# Patient Record
Sex: Female | Born: 1955 | Race: White | Hispanic: No | Marital: Married | Smoking: Never smoker
Health system: Southern US, Community
[De-identification: ages and names within clinical notes are randomized; demographics above are authoritative.]

## PROBLEM LIST (undated history)

## (undated) DIAGNOSIS — G4730 Sleep apnea, unspecified: Secondary | ICD-10-CM

## (undated) DIAGNOSIS — M1990 Unspecified osteoarthritis, unspecified site: Secondary | ICD-10-CM

## (undated) DIAGNOSIS — K219 Gastro-esophageal reflux disease without esophagitis: Secondary | ICD-10-CM

## (undated) DIAGNOSIS — E279 Disorder of adrenal gland, unspecified: Secondary | ICD-10-CM

## (undated) DIAGNOSIS — N952 Postmenopausal atrophic vaginitis: Secondary | ICD-10-CM

## (undated) DIAGNOSIS — C439 Malignant melanoma of skin, unspecified: Secondary | ICD-10-CM

## (undated) DIAGNOSIS — N950 Postmenopausal bleeding: Secondary | ICD-10-CM

## (undated) DIAGNOSIS — R87619 Unspecified abnormal cytological findings in specimens from cervix uteri: Secondary | ICD-10-CM

## (undated) DIAGNOSIS — B3782 Candidal enteritis: Secondary | ICD-10-CM

## (undated) DIAGNOSIS — K5790 Diverticulosis of intestine, part unspecified, without perforation or abscess without bleeding: Secondary | ICD-10-CM

## (undated) DIAGNOSIS — E039 Hypothyroidism, unspecified: Secondary | ICD-10-CM

## (undated) DIAGNOSIS — F419 Anxiety disorder, unspecified: Secondary | ICD-10-CM

## (undated) DIAGNOSIS — Z9882 Breast implant status: Secondary | ICD-10-CM

## (undated) DIAGNOSIS — K9041 Non-celiac gluten sensitivity: Secondary | ICD-10-CM

## (undated) HISTORY — DX: Malignant melanoma of skin, unspecified: C43.9

## (undated) HISTORY — DX: Sleep apnea, unspecified: G47.30

## (undated) HISTORY — DX: Unspecified abnormal cytological findings in specimens from cervix uteri: R87.619

## (undated) HISTORY — DX: Anxiety disorder, unspecified: F41.9

## (undated) HISTORY — DX: Non-celiac gluten sensitivity: K90.41

## (undated) HISTORY — DX: Breast implant status: Z98.82

## (undated) HISTORY — DX: Gastro-esophageal reflux disease without esophagitis: K21.9

## (undated) HISTORY — PX: CRYOTHERAPY: SHX1416

## (undated) HISTORY — DX: Postmenopausal atrophic vaginitis: N95.2

## (undated) HISTORY — DX: Diverticulosis of intestine, part unspecified, without perforation or abscess without bleeding: K57.90

## (undated) HISTORY — PX: MYOMECTOMY: SHX85

## (undated) HISTORY — PX: BREAST ENHANCEMENT SURGERY: SHX7

## (undated) HISTORY — DX: Candidal enteritis: B37.82

## (undated) HISTORY — DX: Postmenopausal bleeding: N95.0

## (undated) HISTORY — DX: Disorder of adrenal gland, unspecified: E27.9

## (undated) HISTORY — PX: BREAST SURGERY: SHX581

## (undated) HISTORY — DX: Unspecified osteoarthritis, unspecified site: M19.90

---

## 1999-12-16 DIAGNOSIS — C439 Malignant melanoma of skin, unspecified: Secondary | ICD-10-CM

## 1999-12-16 HISTORY — PX: MELANOMA EXCISION: SHX5266

## 1999-12-16 HISTORY — DX: Malignant melanoma of skin, unspecified: C43.9

## 2008-02-13 DIAGNOSIS — K5790 Diverticulosis of intestine, part unspecified, without perforation or abscess without bleeding: Secondary | ICD-10-CM

## 2008-02-13 HISTORY — DX: Diverticulosis of intestine, part unspecified, without perforation or abscess without bleeding: K57.90

## 2008-07-17 ENCOUNTER — Encounter: Payer: Self-pay | Admitting: Gastroenterology

## 2008-07-17 LAB — HM COLONOSCOPY: HM Colonoscopy: NORMAL

## 2008-10-27 ENCOUNTER — Encounter: Payer: Self-pay | Admitting: Gastroenterology

## 2008-11-02 ENCOUNTER — Encounter: Payer: Self-pay | Admitting: Gastroenterology

## 2009-01-12 ENCOUNTER — Ambulatory Visit: Payer: Self-pay | Admitting: Gastroenterology

## 2009-01-12 DIAGNOSIS — C439 Malignant melanoma of skin, unspecified: Secondary | ICD-10-CM | POA: Insufficient documentation

## 2009-01-12 DIAGNOSIS — K219 Gastro-esophageal reflux disease without esophagitis: Secondary | ICD-10-CM | POA: Insufficient documentation

## 2009-01-12 DIAGNOSIS — K5730 Diverticulosis of large intestine without perforation or abscess without bleeding: Secondary | ICD-10-CM | POA: Insufficient documentation

## 2009-01-12 DIAGNOSIS — R197 Diarrhea, unspecified: Secondary | ICD-10-CM | POA: Insufficient documentation

## 2009-01-12 LAB — CONVERTED CEMR LAB
AST: 26 units/L (ref 0–37)
CO2: 31 meq/L (ref 19–32)
Chloride: 103 meq/L (ref 96–112)
Eosinophils Absolute: 0.1 10*3/uL (ref 0.0–0.7)
Eosinophils Relative: 2 % (ref 0.0–5.0)
Ferritin: 52.1 ng/mL (ref 10.0–291.0)
Folate: 19.9 ng/mL
Glucose, Bld: 100 mg/dL — ABNORMAL HIGH (ref 70–99)
Iron: 72 ug/dL (ref 42–145)
Lymphocytes Relative: 26.7 % (ref 12.0–46.0)
MCHC: 34.7 g/dL (ref 30.0–36.0)
Monocytes Absolute: 0.4 10*3/uL (ref 0.1–1.0)
Potassium: 4 meq/L (ref 3.5–5.1)
RBC: 4.46 M/uL (ref 3.87–5.11)
Saturation Ratios: 18.7 % — ABNORMAL LOW (ref 20.0–50.0)
Sodium: 140 meq/L (ref 135–145)
TSH: 1.45 microintl units/mL (ref 0.35–5.50)
Tissue Transglutaminase Ab, IgA: 0.4 units (ref <7)
Total Bilirubin: 1.3 mg/dL — ABNORMAL HIGH (ref 0.3–1.2)
Total Protein: 7.2 g/dL (ref 6.0–8.3)
Transferrin: 274.6 mg/dL (ref >=212.0)
WBC: 3.2 10*3/uL — ABNORMAL LOW (ref 4.5–10.5)

## 2009-01-16 ENCOUNTER — Encounter: Payer: Self-pay | Admitting: Gastroenterology

## 2009-01-24 ENCOUNTER — Telehealth: Payer: Self-pay | Admitting: Gastroenterology

## 2009-01-25 ENCOUNTER — Telehealth: Payer: Self-pay | Admitting: Gastroenterology

## 2009-02-13 ENCOUNTER — Ambulatory Visit: Payer: Self-pay | Admitting: Gastroenterology

## 2009-02-13 DIAGNOSIS — R21 Rash and other nonspecific skin eruption: Secondary | ICD-10-CM | POA: Insufficient documentation

## 2009-02-21 ENCOUNTER — Ambulatory Visit (HOSPITAL_COMMUNITY): Admission: RE | Admit: 2009-02-21 | Discharge: 2009-02-21 | Payer: Self-pay | Admitting: Gastroenterology

## 2009-03-01 ENCOUNTER — Telehealth: Payer: Self-pay | Admitting: Gastroenterology

## 2009-03-01 ENCOUNTER — Ambulatory Visit: Payer: Self-pay | Admitting: Gastroenterology

## 2009-04-11 ENCOUNTER — Telehealth (INDEPENDENT_AMBULATORY_CARE_PROVIDER_SITE_OTHER): Payer: Self-pay | Admitting: *Deleted

## 2009-04-23 ENCOUNTER — Ambulatory Visit: Payer: Self-pay | Admitting: Gastroenterology

## 2009-04-23 LAB — CONVERTED CEMR LAB
Basophils Absolute: 0 10*3/uL (ref 0.0–0.1)
Hemoglobin: 12.9 g/dL (ref 12.0–15.0)
Lymphocytes Relative: 34.1 % (ref 12.0–46.0)
Lymphs Abs: 1 10*3/uL (ref 0.7–4.0)
MCHC: 35.1 g/dL (ref 30.0–36.0)
Monocytes Absolute: 0.3 10*3/uL (ref 0.1–1.0)
Monocytes Relative: 10.9 % (ref 3.0–12.0)
Neutro Abs: 1.5 10*3/uL (ref 1.4–7.7)
RBC: 4.22 M/uL (ref 3.87–5.11)
RDW: 12.4 % (ref 11.5–14.6)

## 2009-04-24 ENCOUNTER — Ambulatory Visit: Payer: Self-pay | Admitting: Gastroenterology

## 2009-04-24 DIAGNOSIS — K589 Irritable bowel syndrome without diarrhea: Secondary | ICD-10-CM | POA: Insufficient documentation

## 2009-04-26 ENCOUNTER — Ambulatory Visit: Payer: Self-pay | Admitting: Oncology

## 2009-05-03 ENCOUNTER — Telehealth: Payer: Self-pay | Admitting: Gastroenterology

## 2009-05-09 DIAGNOSIS — D72819 Decreased white blood cell count, unspecified: Secondary | ICD-10-CM | POA: Insufficient documentation

## 2009-05-23 ENCOUNTER — Encounter: Payer: Self-pay | Admitting: Gastroenterology

## 2009-05-23 LAB — CBC & DIFF AND RETIC
Basophils Absolute: 0.1 10*3/uL (ref 0.0–0.1)
EOS%: 1.7 % (ref 0.0–7.0)
Eosinophils Absolute: 0.1 10*3/uL (ref 0.0–0.5)
HGB: 13.2 g/dL (ref 11.6–15.9)
IRF: 0.31 (ref 0.130–0.330)
LYMPH%: 30 % (ref 14.0–49.7)
MCHC: 35.2 g/dL (ref 31.5–36.0)
MONO#: 0.6 10*3/uL (ref 0.1–0.9)
MONO%: 14.3 % — ABNORMAL HIGH (ref 0.0–14.0)
NEUT%: 52.8 % (ref 38.4–76.8)
RBC: 4.38 10*6/uL (ref 3.70–5.45)
lymph#: 1.2 10*3/uL (ref 0.9–3.3)

## 2009-05-23 LAB — MORPHOLOGY: PLT EST: ADEQUATE

## 2009-05-23 LAB — CHCC SMEAR

## 2009-05-23 LAB — COMPREHENSIVE METABOLIC PANEL
ALT: 22 U/L (ref 0–35)
AST: 25 U/L (ref 0–37)
Albumin: 4.4 g/dL (ref 3.5–5.2)
Alkaline Phosphatase: 66 U/L (ref 39–117)
CO2: 31 mEq/L (ref 19–32)
Calcium: 9.3 mg/dL (ref 8.4–10.5)
Creatinine, Ser: 0.78 mg/dL (ref 0.40–1.20)
Total Protein: 7 g/dL (ref 6.0–8.3)

## 2009-05-25 LAB — IMMUNOFIXATION ELECTROPHORESIS
IgA: 289 mg/dL (ref 68–378)
IgG (Immunoglobin G), Serum: 899 mg/dL (ref 694–1618)
IgM, Serum: 195 mg/dL (ref 60–263)
Total Protein, Serum Electrophoresis: 7.4 g/dL (ref 6.0–8.3)

## 2009-05-25 LAB — ANA: Anti Nuclear Antibody(ANA): NEGATIVE

## 2009-05-25 LAB — SEDIMENTATION RATE: Sed Rate: 2 mm/h (ref 0–22)

## 2009-05-25 LAB — HIV ANTIBODY (ROUTINE TESTING W REFLEX)

## 2009-12-15 DIAGNOSIS — B3782 Candidal enteritis: Secondary | ICD-10-CM

## 2009-12-15 HISTORY — DX: Candidal enteritis: B37.82

## 2010-02-22 ENCOUNTER — Telehealth: Payer: Self-pay | Admitting: Gastroenterology

## 2010-04-19 ENCOUNTER — Encounter: Admission: RE | Admit: 2010-04-19 | Discharge: 2010-04-19 | Payer: Self-pay | Admitting: Radiology

## 2010-04-19 ENCOUNTER — Telehealth (INDEPENDENT_AMBULATORY_CARE_PROVIDER_SITE_OTHER): Payer: Self-pay | Admitting: *Deleted

## 2010-06-06 ENCOUNTER — Encounter: Admission: RE | Admit: 2010-06-06 | Discharge: 2010-06-06 | Payer: Self-pay | Admitting: *Deleted

## 2010-12-15 DIAGNOSIS — K9041 Non-celiac gluten sensitivity: Secondary | ICD-10-CM

## 2010-12-15 HISTORY — DX: Non-celiac gluten sensitivity: K90.41

## 2011-01-14 NOTE — Progress Notes (Signed)
  Phone Note Other Incoming   Request: Send information Summary of Call: Request for records received from [facility]. Records faxed to [phone].

## 2011-01-14 NOTE — Progress Notes (Signed)
Summary: triage  Phone Note Call from Patient Call back at Home Phone [phone]   Caller: Patient Call For: Dr. Jarold Motto Reason for Call: Talk to Nurse Summary of Call: pt having night sweats, diarrhea and would like something called in for her... [facility] [address] [facility] [place] Initial call taken by: Vallarie Mare,  February 22, 2010 10:03 AM  Follow-up for Phone Call        Pt states that she had the flu 4 weeks ago and had some diarrhea at that time which improved.  On Feb 26 she was given a zpak for sinus infection and she again had dairrhea which also improved.  Last saturday she ate barbeque and both pt and her husband became sick the next day.  Pt had diarrhea, aches and chills.  Husband has returned to normal but pt is still having loose stools and night sweats.  She thinks she needs something to help her get back to normal.  She asks about an antiobiotic for her "stomach." Follow-up by: Ashok Cordia RN,  February 22, 2010 11:23 AM  Additional Follow-up for Phone Call Additional follow up Details #1::        prescribed Florstar daily metronidazole 250 mg t.i.d. for 10 days.  Additional Follow-up by: Mardella Layman MD FACG,  February 22, 2010 11:58 AM    Additional Follow-up for Phone Call Additional follow up Details #2::    Pt notified.  Pt instructed to report back if does not improve. Follow-up by: Ashok Cordia RN,  February 22, 2010 12:57 PM  New/Updated Medications: METRONIDAZOLE 250 MG  TABS (METRONIDAZOLE) Take 1 three times a day for 10 days FLORASTOR 250 MG CAPS (SACCHAROMYCES BOULARDII) daily for 10 days Prescriptions: METRONIDAZOLE 250 MG  TABS (METRONIDAZOLE) Take 1 three times a day for 10 days  #30 x 0   Entered by:   Ashok Cordia RN   Authorized by:   Mardella Layman MD [facility]   Signed by:   Ashok Cordia RN on 02/22/2010   Method used:   [facility] to        [facility]  [phone]* (retail)       [address] [place], [place]   [address]       Ph: [phone] or [phone]       Fax: [phone]   RxID:   [id number]

## 2011-12-16 DIAGNOSIS — E279 Disorder of adrenal gland, unspecified: Secondary | ICD-10-CM

## 2011-12-16 HISTORY — DX: Disorder of adrenal gland, unspecified: E27.9

## 2012-09-14 DIAGNOSIS — N950 Postmenopausal bleeding: Secondary | ICD-10-CM

## 2012-09-14 HISTORY — DX: Postmenopausal bleeding: N95.0

## 2013-09-15 ENCOUNTER — Encounter: Payer: Self-pay | Admitting: Nurse Practitioner

## 2013-09-16 ENCOUNTER — Ambulatory Visit: Payer: Self-pay | Admitting: Nurse Practitioner

## 2013-10-06 ENCOUNTER — Encounter: Payer: Self-pay | Admitting: Nurse Practitioner

## 2013-10-06 ENCOUNTER — Ambulatory Visit (INDEPENDENT_AMBULATORY_CARE_PROVIDER_SITE_OTHER): Payer: BC Managed Care – PPO | Admitting: Nurse Practitioner

## 2013-10-06 VITALS — BP 100/60 | HR 88 | Resp 16 | Ht 66.0 in | Wt 112.0 lb

## 2013-10-06 DIAGNOSIS — Z01419 Encounter for gynecological examination (general) (routine) without abnormal findings: Secondary | ICD-10-CM

## 2013-10-06 DIAGNOSIS — Z0000 Encounter for general adult medical examination without abnormal findings: Secondary | ICD-10-CM

## 2013-10-06 LAB — POCT URINALYSIS DIPSTICK
Bilirubin, UA: NEGATIVE
Blood, UA: NEGATIVE
Glucose, UA: NEGATIVE
Ketones, UA: NEGATIVE
Nitrite, UA: NEGATIVE
Protein, UA: NEGATIVE
pH, UA: 8

## 2013-10-06 NOTE — Progress Notes (Signed)
Patient ID: Frances Bright, female   DOB: December 26, 1955, 57 y.o.   MRN: [id number] 57 y.o. G0P0 Married Caucasian Fe here for annual exam.  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  Still on HRT with using testosterone cream now.  Also on Minivelle dot and Provera.  No bleeding since 10/13 and had negative PUS and SHGM. This was felt to be secondary to bio-identical HRT with changing doses.  No LMP recorded. Patient is postmenopausal.          Sexually active: yes  The current method of family planning is post menopausal status.    Exercising: yes  WALKING Smoker:  no  Health Maintenance: Pap: 09/14/12, WNL, NEG HR HPV MMG: 04/06/13, [facility] Colonoscopy: 07/17/08, normal, repeat 10 years BMD: 2009, normal TDaP: ? Labs: HB: 13.4 Urine: negative, pH 8.0   reports that she has never smoked. She has never used smokeless tobacco. She reports that she does not drink alcohol or use illicit drugs.  Past Medical History  Diagnosis Date  . Atrophic vaginitis   . H/O bilateral breast implants   . Melanoma 2001    back of neck  . Diverticulosis 3/09    Past Surgical History  Procedure Laterality Date  . Breast enhancement surgery Bilateral 30 years ago    silicone, replaced with saline 6 years ago  . Melanoma excision  2001    back of neck    Current Outpatient Prescriptions  Medication Sig Dispense Refill  . Amino Acids (AMINO ACID PO) Take by mouth.      Marland Kitchen amitriptyline (ELAVIL) 10 MG tablet Take 1 tablet by mouth at bedtime.      Marland Kitchen CALCIUM PO Take by mouth.      . Cholecalciferol (VITAMIN D PO) Take 100 mg by mouth 3 (three) times daily.      . Iodine Strong, Lugols, (IODINE STRONG PO) Take by mouth.      . Magnesium 200 MG TABS Take 1 tablet by mouth 3 (three) times daily.      Marland Kitchen MINIVELLE 0.0375 MG/24HR Place 1 patch onto the skin 2 (two) times a week.      . NONFORMULARY OR COMPOUNDED ITEM Apply topically daily.      . Nutritional Supplements (DHEA PO) Take 10 mg  by mouth daily.      . NYSTATIN PO Take 1 tablet by mouth 3 (three) times daily.      . Omega-3 Fatty Acids (FISH OIL) 600 MG CAPS Take by mouth.      Marland Kitchen OVER THE COUNTER MEDICATION VITALZYMES. Daily      . Probiotic Product (PROBIOTIC DAILY PO) Take by mouth.      . progesterone (PROMETRIUM) 100 MG capsule Take 100 mg by mouth daily.      . vitamin B-12 (CYANOCOBALAMIN) 1000 MCG tablet Take 1,000 mcg by mouth daily.       No current facility-administered medications for this visit.    No family history on file.  ROS:  Pertinent items are noted in HPI.  Otherwise, a comprehensive ROS was negative.  Exam:   BP 100/60  Pulse 88  Resp 16  Ht 5\' 6"  (1.676 m)  Wt 112 lb (50.803 kg)  BMI 18.09 kg/m2 Height: 5\' 6"  (167.6 cm)  Ht Readings from Last 3 Encounters:  10/06/13 5\' 6"  (1.676 m)  04/24/09 5\' 6"  (1.676 m)  03/01/09 5\' 6"  (1.676 m)    General appearance: alert, cooperative and appears stated age  Head: Normocephalic, without obvious abnormality, atraumatic Neck: no adenopathy, supple, symmetrical, trachea midline and thyroid normal to inspection and palpation Lungs: clear to auscultation bilaterally Breasts: positive findings: implants bilaterally without mass. She has capsulation of implants with right side harder. Heart: regular rate and rhythm Abdomen: soft, non-tender; no masses,  no organomegaly Extremities: extremities normal, atraumatic, no cyanosis or edema Skin: Skin color, texture, turgor normal. No rashes or lesions Lymph nodes: Cervical, supraclavicular, and axillary nodes normal. No abnormal inguinal nodes palpated Neurologic: Grossly normal   Pelvic: External genitalia:  red lesions right inner thigh that looks like eczema.              Urethra:  normal appearing urethra with no masses, tenderness or lesions              Bartholin's and Skene's: normal                 Vagina: normal appearing vagina with normal color and discharge, no lesions               Cervix: anteverted              Pap taken: no Bimanual Exam:  Uterus:  normal size, contour, position, consistency, mobility, non-tender              Adnexa: no mass, fullness, tenderness               Rectovaginal: Confirms               Anus:  normal sphincter tone, no lesions  A:  Well Woman with normal exam  Postmenopausal on HRT - bio-identical HRT from PCP  PMB 09/2012 with negative PUS/ SHGM  Chronic problems with yeast - now on maintenance with PCP  P:   Pap smear as per guidelines   Mammogram patient needs a 6 month follow up on a lymph node but very reluctant to get done because of the amount of pain. She is encouraged to go back.  Counseled on breast self exam, adequate intake of calcium and vitamin D, diet and exercise return annually or prn  An After Visit Summary was printed and given to the patient.

## 2013-10-06 NOTE — Patient Instructions (Signed)

## 2013-10-09 NOTE — Progress Notes (Signed)
Encounter reviewed by Dr. Anisten Tomassi Silva.  

## 2014-02-09 ENCOUNTER — Telehealth: Payer: Self-pay | Admitting: *Deleted

## 2014-02-09 NOTE — Telephone Encounter (Signed)
Please contact [facility] to see if patient had MMG follow up in 08-2013 and get report.  If not, please contact her for scheduling.  Notify me of date or if declines to schedule.

## 2014-02-10 NOTE — Telephone Encounter (Signed)
Called [facility] and pt's last MMG and [place] was 03/2013. Patient hasn't followed up since then.  - LM for pt to call back re: MMG.

## 2014-02-13 NOTE — Telephone Encounter (Signed)
Was due in sep 2014 for a bilateral 3D diagnostic mammogram Patient has not been.

## 2014-02-13 NOTE — Telephone Encounter (Signed)
Second attempt to contact pt.  - LM to call back re: scheduling MMG.

## 2014-03-01 ENCOUNTER — Encounter: Payer: Self-pay | Admitting: Obstetrics & Gynecology

## 2014-03-02 NOTE — Telephone Encounter (Signed)
Letter written.  Out of recall.

## 2014-09-06 ENCOUNTER — Telehealth: Payer: Self-pay | Admitting: *Deleted

## 2014-09-06 NOTE — Telephone Encounter (Signed)
Patient had 04/06/13 Breast US recommendations were for a 3D mammogram in September of 2014. Patient has not scheduled.  Left Message To Call Back to schedule mammogram.

## 2014-09-11 NOTE — Telephone Encounter (Signed)
Left Message To Call Back  

## 2014-09-11 NOTE — Telephone Encounter (Signed)
Patient called back States she hasn't had her Mammogram because she has had bad experiencse in the past with getting her mammograms and she's hesitant because of it. She's experienced severe pain with mammogram. She has breast implants in and thinks her Left breast may be ruptured and she wants to have a MRI as opposed to a mammogram she thinks that having a mammogram will make it worse. She said she knows her body and she doesn't want to make it worse, and that she knows that she needs to get her breast implants replaced but she doesn't want to spend unnecessarily money if she doesn't have too. Says if Dr. Darryll Capers will exam her that's fine. Told the patient that Dr. Sabra Heck is out of the office this week, but will be back next week and that I would send her a message and get back with her. The patient is okay with waiting and appreciative of [place] following up with her on this.   Please advise.

## 2014-09-15 NOTE — Telephone Encounter (Signed)
Spoke with Frances Bright as Frances Bright. Office visit/consult scheduled with Dr.Bertrand for examination and discussion of mammogram versus MRI for 10/7 at 10:15am. Left message for patient  to call Ani Deoliveira at [phone] to advise of appointment date and time.

## 2014-09-15 NOTE — Telephone Encounter (Signed)
Spoke with patient. Notified of appointment date and time as seen below. Patient is agreeable.  Routing to provider for final review. Patient agreeable to disposition. Will close encounter

## 2014-09-15 NOTE — Telephone Encounter (Signed)
This is Dr. Quincy Simmonds stepping in to assist Dr. Sabra Heck in her absence.  Please set up a consultation with the patient and Dr. Isaiah Blakes so she can be examined and then discuss the mammogram versus the MRI.  I think this will really help the patient.   [place]

## 2014-09-15 NOTE — Telephone Encounter (Signed)
Left Message To Call Back  

## 2014-10-12 ENCOUNTER — Encounter: Payer: Self-pay | Admitting: Nurse Practitioner

## 2014-10-12 ENCOUNTER — Ambulatory Visit (INDEPENDENT_AMBULATORY_CARE_PROVIDER_SITE_OTHER): Payer: BC Managed Care – PPO | Admitting: Nurse Practitioner

## 2014-10-12 VITALS — BP 110/68 | HR 72 | Ht 66.0 in | Wt 116.0 lb

## 2014-10-12 DIAGNOSIS — Z01419 Encounter for gynecological examination (general) (routine) without abnormal findings: Secondary | ICD-10-CM

## 2014-10-12 DIAGNOSIS — Z0000 Encounter for general adult medical examination without abnormal findings: Secondary | ICD-10-CM

## 2014-10-12 DIAGNOSIS — Z780 Asymptomatic menopausal state: Secondary | ICD-10-CM

## 2014-10-12 NOTE — Patient Instructions (Signed)
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
## 2014-10-13 NOTE — Progress Notes (Signed)
Encounter reviewed by Dr. Josefa Half. Will get reports for breast evaluation for 2015.

## 2014-10-17 LAB — IPS PAP TEST WITH HPV

## 2015-10-16 ENCOUNTER — Ambulatory Visit (INDEPENDENT_AMBULATORY_CARE_PROVIDER_SITE_OTHER): Payer: BLUE CROSS/BLUE SHIELD | Admitting: Nurse Practitioner

## 2015-10-16 ENCOUNTER — Encounter: Payer: Self-pay | Admitting: Nurse Practitioner

## 2015-10-16 VITALS — BP 98/60 | HR 80 | Resp 14 | Ht 65.75 in | Wt 115.0 lb

## 2015-10-16 DIAGNOSIS — Z79890 Hormone replacement therapy (postmenopausal): Secondary | ICD-10-CM | POA: Diagnosis not present

## 2015-10-16 DIAGNOSIS — Z01419 Encounter for gynecological examination (general) (routine) without abnormal findings: Secondary | ICD-10-CM | POA: Diagnosis not present

## 2015-10-16 DIAGNOSIS — Z0000 Encounter for general adult medical examination without abnormal findings: Secondary | ICD-10-CM

## 2015-10-16 NOTE — Progress Notes (Signed)
Patient ID: Frances Bright, female   DOB: 02-Jul-1956, 58 y.o.   MRN: [id number] 59 y.o. G0P0 Married  Caucasian Fe here for annual exam.   Several things this summer that has caused a GI problem with malabsorption again.  Weight loss of 5-6 lbs.  First had poison ivy and took steroids.  Then took Doxycycline for 3 weeks for eye infection. She then had food poisoning.  Still on probiotic nutritional medical food. For GI disorder.  Doing well on HRT this year from Dr. Sharol Roussel..   Patient's last menstrual period was 12/15/2006.          Sexually active: Yes.    The current method of family planning is post menopausal status.    Exercising: Yes.    walking Smoker:  no  Health Maintenance: Pap:  10-12-14 normal with Negative HR HPV MMG:  09-20-14    6 MM cyst - repeat U/Sin 1 year- patient has scheduled  Colonoscopy:  07-17-08 WNL BMD:   Years ago TDaP:  unsure Labs: PCP does labs    reports that she has never smoked. She has never used smokeless tobacco. She reports that she does not drink alcohol or use illicit drugs.  Past Medical History  Diagnosis Date  . Atrophic vaginitis   . H/O bilateral breast implants   . Melanoma ([place]) 2001    back of neck  . Diverticulosis 3/09  . 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 Dr Sharol Roussel  . Enteritis, candida 2011  . Gluten intolerance 2012    Past Surgical History  Procedure Laterality Date  . Breast enhancement surgery Bilateral 30 years ago    silicone, replaced with saline 6 years ago  . Melanoma excision  2001    back of neck    Current Outpatient Prescriptions  Medication Sig Dispense Refill  . Amino Acids (AMINO ACID PO) Take by mouth.    Marland Kitchen amitriptyline (ELAVIL) 10 MG tablet Take 2 tablets by mouth at bedtime.     Marland Kitchen CALCIUM PO Take by mouth.    . Cholecalciferol (VITAMIN D PO) Take 100 mg by mouth 3 (three) times daily.    . Iodine Strong, Lugols, (IODINE STRONG PO) Take by mouth.    . Magnesium 200 MG TABS Take 1 tablet  by mouth daily.     Marland Kitchen MINIVELLE 0.0375 MG/24HR Place 1 patch onto the skin 2 (two) times a week.    . NONFORMULARY OR COMPOUNDED ITEM Apply topically daily.    . Nutritional Supplements (DHEA PO) Take 10 mg by mouth daily.    . NYSTATIN PO Take 2 tablets by mouth 2 (two) times daily.     . Omega-3 Fatty Acids (FISH OIL) 600 MG CAPS Take by mouth.    . progesterone (PROMETRIUM) 100 MG capsule Take 100 mg by mouth 2 (two) times daily.     . vitamin B-12 (CYANOCOBALAMIN) 1000 MCG tablet Take 1,000 mcg by mouth daily.    . Probiotic Product (PROBIOTIC DAILY PO) Take by mouth.     No current facility-administered medications for this visit.    Family History  Problem Relation Age of Onset  . Hypertension Mother   . Hyperlipidemia Mother   . Cancer Father     ROS:  Pertinent items are noted in HPI.  Otherwise, a comprehensive ROS was negative.  Exam:   BP 98/60 mmHg  Pulse 80  Resp 14  Ht 5' 5.75" (1.67 m)  Wt 115 lb (52.164 kg)  BMI 18.70 kg/m2  LMP 12/15/2006 Height: 5' 5.75" (167 cm) Ht Readings from Last 3 Encounters:  10/16/15 5' 5.75" (1.67 m)  10/12/14 5\' 6"  (1.676 m)  10/06/13 5\' 6"  (1.676 m)    General appearance: alert, cooperative and appears stated age Head: Normocephalic, without obvious abnormality, atraumatic Neck: no adenopathy, supple, symmetrical, trachea midline and thyroid normal to inspection and palpation Lungs: clear to auscultation bilaterally Breasts: positive findings: fibrocystic changes and implants bilaterally with capsulation right > than left Heart: regular rate and rhythm Abdomen: soft, non-tender; no masses,  no organomegaly Extremities: extremities normal, atraumatic, no cyanosis or edema Skin: Skin color, texture, turgor normal. No rashes or lesions Lymph nodes: Cervical, supraclavicular, and axillary nodes normal. No abnormal inguinal nodes palpated Neurologic: Grossly normal   Pelvic: External genitalia:  no lesions               Urethra:  normal appearing urethra with no masses, tenderness or lesions              Bartholin's and Skene's: normal                 Vagina: normal appearing vagina with normal color and discharge, no lesions              Cervix: anteverted              Pap taken: No. Bimanual Exam:  Uterus:  normal size, contour, position, consistency, mobility, non-tender              Adnexa: no mass, fullness, tenderness               Rectovaginal: Confirms               Anus:  normal sphincter tone, no lesions  Chaperone present: yes  A:  Well Woman with normal exam  Postmenopausal on HRT - bio-identical HRT from Dr. Sharol Roussel PMB 09/2012 with negative PUS/ SHGM  Chronic problems with yeast - now on maintenance with PCP IBS - much better on Elavil S/P Breast implants  P:   Reviewed health and wellness pertinent to exam  Pap smear as above  Mammogram is due now due and will be getting [place] secondary to capsulation of implants  Will get Hep C and follow  Counseled on breast self exam, adequate intake of calcium and vitamin D, diet and exercise return annually or prn  An After Visit Summary was printed and given to the patient.

## 2015-10-16 NOTE — Patient Instructions (Addendum)

## 2015-10-17 LAB — HEPATITIS C ANTIBODY: HCV Ab: NEGATIVE

## 2015-10-18 NOTE — Progress Notes (Signed)
Encounter reviewed by Dr. Vishwa Dais Amundson C. Silva.  

## 2015-11-30 ENCOUNTER — Other Ambulatory Visit: Payer: Self-pay | Admitting: Internal Medicine

## 2015-12-03 ENCOUNTER — Other Ambulatory Visit: Payer: Self-pay | Admitting: Internal Medicine

## 2015-12-03 DIAGNOSIS — E617 Deficiency of multiple nutrient elements: Secondary | ICD-10-CM

## 2015-12-03 DIAGNOSIS — M8580 Other specified disorders of bone density and structure, unspecified site: Secondary | ICD-10-CM

## 2016-01-15 ENCOUNTER — Ambulatory Visit
Admission: RE | Admit: 2016-01-15 | Discharge: 2016-01-15 | Disposition: A | Discharge status: Home / Self Care | Payer: BLUE CROSS/BLUE SHIELD | Source: Ambulatory Visit | Attending: Internal Medicine | Admitting: Internal Medicine

## 2016-01-15 DIAGNOSIS — E617 Deficiency of multiple nutrient elements: Secondary | ICD-10-CM

## 2016-01-15 DIAGNOSIS — M8580 Other specified disorders of bone density and structure, unspecified site: Secondary | ICD-10-CM

## 2016-02-20 ENCOUNTER — Encounter: Payer: Self-pay | Admitting: Nurse Practitioner

## 2016-02-20 ENCOUNTER — Ambulatory Visit (INDEPENDENT_AMBULATORY_CARE_PROVIDER_SITE_OTHER): Payer: BLUE CROSS/BLUE SHIELD | Admitting: Nurse Practitioner

## 2016-02-20 VITALS — BP 102/60 | HR 88 | Resp 14 | Wt 116.0 lb

## 2016-02-20 DIAGNOSIS — N644 Mastodynia: Secondary | ICD-10-CM

## 2016-02-20 NOTE — Progress Notes (Signed)
Patient is scheduled for R Breast Ultrasound at [facility] for 02/21/16 at 1300. Patient is given printed appointment information and order to bring with her to appointment tomorrow.

## 2016-02-20 NOTE — Progress Notes (Signed)
   Subjective:   60 y.o. Married Caucasian female G0  presents for evaluation of right breast pain and changes of right breast implant.   States she went to sleep on the couch early November and woke up in a odd position that seemed to cause a pressure or pain against her right implant. The day after the election she noted a constant pain with deep breathing.  Patient sought evaluation because of breast tenderness and changes in fullness of implant on the right..  Since this incident she has found the right implant not as soft.  Prior history of implant rupture 1981 with replacement in 2004.  Contributing factors include nulliparous. Denies fatigue and pain with deep inspiration on the right. Patient denies history of trauma, bites, or injuries. Last mammogram was [place] 11/16/ 2016 showing multiple cyst on the right.   Review of Systems Pertinent items noted in HPI and remainder of comprehensive ROS otherwise negative.   Objective:   General appearance: alert, cooperative and appears stated age no skin lesions or rash. Head: Normocephalic, without obvious abnormality, atraumatic Neck: supple, symmetrical, trachea midline and thyroid not enlarged, symmetric, no tenderness/mass/nodules Back: symmetric, no curvature. ROM normal. No CVA tenderness. Lungs: clear to auscultation bilaterally, no rales Breasts: positive findings: implants bilaterally with right implant very firm and hard compared to the left.  No warmth or signs of infection.  No lesions, no nodes, no nipple discharge. Heart: regular rate and rhythm Abdomen: normal findings: no masses palpable and no organomegaly    Assessment:   ASSESSMENT:Patient is diagnosed with history of bilateral breast implants   History of pain right implant with a change in texture and size on the right   Past history of implant rupture   Adhesions of right implant   History of right breast cyst   Plan:   PLAN: The patient has a documented plan to follow  with further care of [place] of right breast. on 02/21/16.  Per Dr. Isaiah Blakes pt does not get Mammogram.  She may need breast MRI to evaluate the implant. 2. PLAN: FOLLOW as needed

## 2016-02-20 NOTE — Patient Instructions (Signed)
We will schedule [place] of right breast.

## 2016-02-22 NOTE — Progress Notes (Signed)
Encounter reviewed by Dr. Davan Nawabi Amundson C. Silva.  

## 2016-03-04 ENCOUNTER — Telehealth: Payer: Self-pay | Admitting: Emergency Medicine

## 2016-03-04 NOTE — Telephone Encounter (Signed)
Received Ultrasound report from [facility] dated 02/21/2016.  Patient with R Breast Pain. History of Dense breasts and bilateral saline implants.  Last mammogram completed on 08/26/2012 (diagnostic bilateral) at [facility] copy in paper chart.  Patient subsequently called office and gave message to Dr. Sabra Heck on 08/30/12 that she declines future mammogram related to pain during mammogram.   Protocol at [place] is that patient must have a Mammogram before Breast MRI is scheduled. If any concerns regarding protocol must be MD to MD conversation between ordering physician and Medical Director at [place], Dr. Lovey Newcomer per Volanda Napoleon at [place].   Patient was seen in our office by Kem Boroughs, FNP for R Breast Pain on 02/20/16.

## 2016-03-07 ENCOUNTER — Telehealth: Payer: Self-pay | Admitting: Nurse Practitioner

## 2016-03-07 NOTE — Telephone Encounter (Signed)
Call to patient. She is getting ready to go into a meeting with her accountant and states she will call back today or Monday.

## 2016-03-07 NOTE — Telephone Encounter (Signed)
Returning a call to [facility]. Closed previous telephone note by accident.

## 2016-03-07 NOTE — Telephone Encounter (Signed)
Patient is returning a call to [place]. °

## 2016-03-10 NOTE — Telephone Encounter (Signed)
Jessica from [facility] called to let Olivia Mackie know that she will talk to Dr. Isaiah Blakes the next time she is in the office in regards to patient and will give her a call back with what she says.

## 2016-03-10 NOTE — Telephone Encounter (Signed)
Message left to return call to Frances Bright at [phone].    

## 2016-03-10 NOTE — Telephone Encounter (Signed)
Frances Mcalpine, RN at 03/04/2016 10:19 AM     Received Ultrasound report from [facility] dated 02/21/2016. Patient with R Breast Pain. History of Dense breasts and bilateral saline implants.  Last mammogram completed on 08/26/2012 (diagnostic bilateral) at [facility] copy in paper chart.  Patient subsequently called office and gave message to Dr. Sabra Heck on 08/30/12 that she declines future mammogram related to pain during mammogram.   Protocol at [facility] is that patient must have a Mammogram before Breast MRI is scheduled. If any concerns regarding protocol must be MD to MD conversation between ordering physician and Medical Director at [facility], Dr. Lovey Newcomer per Volanda Napoleon at [facility].   Patient was seen in our office by Kem Boroughs, FNP for R Breast Pain on 02/20/16.

## 2016-03-10 NOTE — Telephone Encounter (Signed)
Patient returned call and I advised that I spoke with Dr. Sabra Heck regarding her options for R Breast Imaging.  19 minutes on the phone with patient to discuss her concerns and questions regarding her concerns about mammography.   Advised patient that the imaging centers we refer to [facility]) for Breast MRI's require a mammogram prior to MRI. Discussed rationale for this with patient as mammogram helps with reading of MRI and with if there is an abnormal mammogram, targeted biopsy can be completed to specific area.   Patient states she has been having R Breast Pain and states that she thinks there could be a tumor located under the implant. Sometimes she feels pain and has to breathe more shallow. She states this has been ongoing and was waiting to see if symptoms improved on their own and symptoms have subsided but that sometimes she can feel the pain.  Patient states she has "hung in there" in the past with prior mammograms but that she believes that mammograms have caused a prior rupture and states "how hard are they supposed to squeeze you" "I even screamed and they wouldn't stop."  I advised that Dr. Sabra Heck gave an option to have pre-medication for pain or valium prior to imaging and patient states "No. I will not do that." She states when she has her mammogram she has to sign a form that if implants are ruptured then [employer] will not be held liable. She also states "I was told that mammograms couldn't see much and that there is tissue that they may not be able to see."  Advised patient that yearly screening mammogram is recommended.  Discussed other options reviewed with Dr. Sabra Heck. Offered to schedule chest xray (pa/lat), schedule consult with breast surgeon at [facility] (Dr. Louisa Second that a  mammogram would likely be recommended as well)., Recommendation to stop caffeine. Patient states she will stop caffeine to see if this  helps with pain.   Patient expresses that she would consider paying out of pocket for MRI. Advised that she could potentially do so, but that a mammogram may still be necessary for radiologist to read final results of mammogram.   Discussed with patient that she could check with her insurance coverage regarding MRI and also she could research imaging centers that would complete MRI without prior Mammogram.   Patient states "I am disheartened that this is all being put back on me. It's being put back on me because I won't have a mammogram. How far are you supposed to let them squeeze you and hurt you before you tell them to stop, and when you tell them to stop, they don't."   Patient states she has previously seen Dr. Guerry Minors at [facility]. Offered to schedule consult with her to discuss her concerns. She accepts this. Called at this time and message is left with Janett Billow at [facility] to schedule consult with Dr. Guerry Minors for patient.  Will wait for return call from [facility] and call patient to plan consult.

## 2016-03-11 NOTE — Telephone Encounter (Signed)
Call to [place] at [facility].  She states Dr. Guerry Minors will be in the office tomorrow 03/12/16 and she will discuss with Dr. Guerry Minors and call patient directly.   Call to patient for update. Left detailed message, okay per designated party release form to advise that Frances Bright will be contacting her directly and to call us back if she does not hear from [place].   Update to Dr. Sabra Heck.

## 2016-03-13 NOTE — Telephone Encounter (Signed)
Spoke with Janett Billow at [facility]. Patient is aware and scheduled for consult with Dr Guerry Minors 03/27/16.  Will route message to Dr. Sabra Heck and close encounter.

## 2016-03-13 NOTE — Telephone Encounter (Signed)
Janett Billow from [facility] calling in regards to patient. Best # to reach: [phone]

## 2016-04-07 DIAGNOSIS — Z9882 Breast implant status: Secondary | ICD-10-CM | POA: Insufficient documentation

## 2016-10-02 ENCOUNTER — Telehealth: Payer: Self-pay | Admitting: Nurse Practitioner

## 2016-10-02 NOTE — Telephone Encounter (Signed)
Left message regarding upcoming appointment has been canceled and needs to be rescheduled. °

## 2016-10-17 ENCOUNTER — Ambulatory Visit: Payer: BLUE CROSS/BLUE SHIELD | Admitting: Nurse Practitioner

## 2016-11-04 ENCOUNTER — Ambulatory Visit: Payer: BLUE CROSS/BLUE SHIELD | Admitting: Nurse Practitioner

## 2016-12-02 ENCOUNTER — Telehealth: Payer: Self-pay | Admitting: *Deleted

## 2016-12-02 NOTE — Telephone Encounter (Signed)
Patient in 04 recall for bilateral breast U/S. Please contact patient and schedule

## 2016-12-05 NOTE — Telephone Encounter (Signed)
Patient in 04 recall from 10/2015. Patient had U/S on 03/27/16 due to tightness in right breast- consult with surgeon recommended. Patient saw Dr. Iran Planas on 04/07/16- see progress note in EPIC. Removed from hold on 07/01/16. Please advise on recall status.   Routing to provider for review.    Cc. Starlyn Skeans

## 2016-12-09 NOTE — Telephone Encounter (Signed)
On chart review, patient is using bilateral breast ultrasound for her screening of the breast.  She was due for this in November 2017.  I would contact her for this.  She saw the plastic surgeon for breast pain in April 2017.   Cc- Frances Bright

## 2016-12-11 NOTE — Telephone Encounter (Signed)
Message left to return call to Frances Bright at [phone].    

## 2016-12-12 NOTE — Telephone Encounter (Signed)
Please have patient make an appointment for her annual exam in January 2018 with [facility].  If she has had breast implant replacement surgery, we are not up to date in her care.  Please make this appointment and place her in mammogram recall for January 2018 until she can be seen.   Cc- Lamont Snowball

## 2016-12-12 NOTE — Telephone Encounter (Signed)
Message left to return call to Aadhya Bustamante at [phone].    

## 2016-12-12 NOTE — Telephone Encounter (Signed)
Patient returning call.

## 2016-12-12 NOTE — Telephone Encounter (Signed)
Patient returned call. Patient states that she has not been proactive about having her bilateral breast U/S done because she had replacement surgery on 07/11/16 and has "wanted to let that rest before having another ultrasound." Patient aware U/S is due. RN advised this message would be sent to Dr. Quincy Simmonds for review and our office would call with any additional recommendations.   Routing to provider for review.

## 2016-12-16 NOTE — Telephone Encounter (Signed)
Message left to return call to Lossie Kalp at [phone].    

## 2016-12-22 NOTE — Telephone Encounter (Signed)
Call to patient. Left message to call back. Follow-up call regarding scheduling annual exam with Patty and update on breast issues.

## 2016-12-23 NOTE — Telephone Encounter (Signed)
Patient returning your call ask that we call her back next week

## 2017-01-15 NOTE — Telephone Encounter (Signed)
Message left to return call to [facility] at [phone] to schedule AEX.

## 2017-01-20 NOTE — Telephone Encounter (Signed)
Patient scheduled for AEX 01/27/17 with Kem Boroughs, FNP. Encounter closed.

## 2017-01-27 ENCOUNTER — Encounter: Payer: Self-pay | Admitting: Nurse Practitioner

## 2017-01-27 ENCOUNTER — Ambulatory Visit (INDEPENDENT_AMBULATORY_CARE_PROVIDER_SITE_OTHER): Payer: Self-pay | Admitting: Nurse Practitioner

## 2017-01-27 VITALS — BP 118/60 | HR 72 | Resp 12 | Ht 66.0 in | Wt 111.8 lb

## 2017-01-27 DIAGNOSIS — Z0000 Encounter for general adult medical examination without abnormal findings: Secondary | ICD-10-CM

## 2017-01-27 DIAGNOSIS — Z01411 Encounter for gynecological examination (general) (routine) with abnormal findings: Secondary | ICD-10-CM

## 2017-01-27 DIAGNOSIS — Z79890 Hormone replacement therapy (postmenopausal): Secondary | ICD-10-CM

## 2017-01-27 DIAGNOSIS — Z124 Encounter for screening for malignant neoplasm of cervix: Secondary | ICD-10-CM

## 2017-01-27 NOTE — Progress Notes (Signed)
Patient ID: Frances Bright, female   DOB: 1956-05-18, 61 y.o.   MRN: [id number]  61 y.o. G0P0000 Married  Caucasian Fe here for annual exam.  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  She then went to see plastic surgeon and had saline bilateral implant reconstruction 06/30/2016.  Since then they have dropped!.  She will need replacements again and will need Aloderm.  She also had parasite testing done and she had to be treated with Cipro and Septra.  She continues to get bio identical HRT from Dr. Sharol Roussel and wants to continue.  Patient's last menstrual period was 12/15/2006.          Sexually active: Yes.    The current method of family planning is post menopausal status.    Exercising: Yes.    Walking Smoker:  no  Health Maintenance: Pap: 10/12/14, Negative with Neg HR HPV  09/14/12, Negative with neg HR HPV MMG: WILL NOT HAVE MAMMO:   10/31/15 Bilateral Ultrasound: Bi-Rads 2: Benign, 03/27/16 Right Breast Ultrasound, Bi-Rads 1: Negative, but referral to plastic surgeon for capsulitis Colonoscopy: 07/17/08, Normal BMD: 01/15/16 T Score: -1.6 Spine / -0.9 Right Femur Neck / 0.0 Left Femur Neck TDaP:  unsure Shingles: No-Pt declines Pneumonia: Not indicated due to age Hep C: 10/16/15 HIV: 05/23/09 Labs: blood not drawn     reports that she has never smoked. She has never used smokeless tobacco. She reports that she does not drink alcohol or use drugs.  Past Medical History:  Diagnosis Date  . 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 Dr Sharol Roussel  . Atrophic vaginitis   . Diverticulosis 3/09  . Enteritis, candida 2011  . Gluten intolerance 2012  . H/O bilateral breast implants   . Melanoma ([place]) 2001   back of neck  . PMB (postmenopausal bleeding) 09/2012   On HRT - PUS/ SHGM with fibroids    Past Surgical History:  Procedure Laterality Date  . BREAST ENHANCEMENT SURGERY Bilateral 30 years ago   silicone, replaced with saline 6 years ago  . MELANOMA  EXCISION  2001   back of neck  . MYOMECTOMY  about 1987   for fibroids    Current Outpatient Prescriptions  Medication Sig Dispense Refill  . Amino Acids (AMINO ACID PO) Take by mouth.    Marland Kitchen CALCIUM PO Take by mouth.    . Cholecalciferol (VITAMIN D PO) Take 100 mg by mouth 3 (three) times daily.    . Iodine Strong, Lugols, (IODINE STRONG PO) Take by mouth.    . Magnesium 200 MG TABS Take 1 tablet by mouth daily.     Marland Kitchen MINIVELLE 0.0375 MG/24HR Place 1 patch onto the skin 2 (two) times a week.    . NONFORMULARY OR COMPOUNDED ITEM Apply topically daily.    . Nutritional Supplements (DHEA PO) Take 10 mg by mouth daily.    . Omega-3 Fatty Acids (FISH OIL) 600 MG CAPS Take by mouth.    . Probiotic Product (PROBIOTIC DAILY PO) Take by mouth.    . progesterone (PROMETRIUM) 100 MG capsule Take 100 mg by mouth 2 (two) times daily.     . Testosterone 75 MG PLLT Inject into the skin.    Marland Kitchen vitamin B-12 (CYANOCOBALAMIN) 1000 MCG tablet Take 1,000 mcg by mouth daily.     No current facility-administered medications for this visit.     Family History  Problem Relation Age of Onset  . Hypertension Mother   .  Hyperlipidemia Mother   . Cancer Father     ROS:  Pertinent items are noted in HPI.  Otherwise, a comprehensive ROS was negative.  Exam:   BP 118/60 (BP Location: Right Arm, Patient Position: Sitting, Cuff Size: Normal)   Pulse 72   Resp 12   Ht 5\' 6"  (1.676 m)   Wt 111 lb 12.8 oz (50.7 kg)   LMP 12/15/2006   BMI 18.04 kg/m  Height: 5\' 6"  (167.6 cm) Ht Readings from Last 3 Encounters:  01/27/17 5\' 6"  (1.676 m)  10/16/15 5' 5.75" (1.67 m)  10/12/14 5\' 6"  (1.676 m)    General appearance: alert, cooperative and appears stated age Head: Normocephalic, without obvious abnormality, atraumatic Neck: no adenopathy, supple, symmetrical, trachea midline and thyroid normal to inspection and palpation Lungs: clear to auscultation bilaterally Breasts: positive findings: implants  bilaterally, that are lower than expected.  The saline implants are without mass or capsulitis. Heart: regular rate and rhythm Abdomen: soft, non-tender; no masses,  no organomegaly Extremities: extremities normal, atraumatic, no cyanosis or edema Skin: Skin color, texture, turgor normal. No rashes or lesions Lymph nodes: Cervical, supraclavicular, and axillary nodes normal. No abnormal inguinal nodes palpated Neurologic: Grossly normal   Pelvic: External genitalia:  no lesions              Urethra:  normal appearing urethra with no masses, tenderness or lesions              Bartholin's and Skene's: normal                 Vagina: normal appearing vagina with normal color and discharge, no lesions              Cervix: anteverted              Pap taken: Yes.   Bimanual Exam:  Uterus:  normal size, contour, position, consistency, mobility, non-tender              Adnexa: no mass, fullness, tenderness               Rectovaginal: Confirms               Anus:  normal sphincter tone, no lesions  Chaperone present: yes  A:  Well Woman with normal exam  Postmenopausal on HRT - bio-identical HRT from Dr. Sharol Roussel PMB 09/2012 with negative PUS/ SHGM  Chronic problems with yeast - now on maintenance with PCP IBS -  now off Elavil S/P Breast implants with replacement 06/30/2016    P:   Reviewed health and wellness pertinent to exam  Pap smear is done  Mammogram is not done, but [place] is done by Dr. Isaiah Blakes  Pt is aware that we will not give HRT - this is given by Dr. Sharol Roussel - currently the dose of testosterone is being adjusted as her levels are too high.  Counseled about risk of DVT, CVA, cancer, etc.  Counseled on breast self exam, mammography screening, use and side effects of HRT, adequate intake of calcium and vitamin D, diet and exercise return annually or prn  An After Visit Summary was printed and given to the patient.  She is now self  pay and did not want additional testing done.

## 2017-01-27 NOTE — Patient Instructions (Signed)

## 2017-01-29 LAB — IPS PAP TEST WITH REFLEX TO HPV

## 2017-02-01 NOTE — Progress Notes (Signed)
Encounter reviewed by Dr. Ilyas Lipsitz Amundson C. Silva.  

## 2017-03-25 ENCOUNTER — Telehealth: Payer: Self-pay | Admitting: Nurse Practitioner

## 2017-03-25 NOTE — Telephone Encounter (Signed)
Patient would like to come in for hormone testing.

## 2017-03-25 NOTE — Telephone Encounter (Signed)
Spoke with patient. Patient states she usually has hormone labs tested with Dr. Sharol Roussel, but has been notified that Dr. Sharol Roussel is taking a break. Patient states she needs to have labs drawn, can they be done through Kem Boroughs, NP? Patient states she is still taking Minivelle, DHEA 5mg  daily, progesterone, and testosterone. Patient states testosterone dose is still being decreased d/t increase in acne. Patient is unsure of exact labs that she usually has drawn. Advised patient would review with Kem Boroughs, NP and return call with recommendations, patient is agreeable.    Kem Boroughs, NP -ok to schedule for labs and what labs?

## 2017-03-25 NOTE — Telephone Encounter (Addendum)
Spoke with patient, advised as seen below per Kem Boroughs, NP. Patient states she was advised from Dr. Yancey Flemings office by email that patients would need to find another provider for labs.  Patient states she will clarify this and return call if additional recommendations needed. Patient thankful for follow-up and verbalizes understanding.    Reviewed with Kem Boroughs, NP -can recommend patient to [facility] for bio identical labs: [facility] [address], [place], [place] [address]  [phone]

## 2017-03-25 NOTE — Telephone Encounter (Signed)
The bio identical labs are not something that we order and if she had a prescription from Dr. Sharol Roussel then she could have done at [facility] or [facility].  Then results would be sent to Dr. Sharol Roussel.

## 2017-03-31 NOTE — Telephone Encounter (Signed)
Left message to call Saadiya Wilfong at [phone].  

## 2017-03-31 NOTE — Telephone Encounter (Signed)
Spoke with patient. Patient states she has gotten the names of a few providers that will do the bio identical labs, has enough medication for 3 months, will wait and see what Dr. Yancey Flemings practice does. Patient thankful for follow-up call.  Routing to provider for final review. Patient is agreeable to disposition. Will close encounter.

## 2017-04-29 ENCOUNTER — Ambulatory Visit (INDEPENDENT_AMBULATORY_CARE_PROVIDER_SITE_OTHER): Payer: BLUE CROSS/BLUE SHIELD | Admitting: Nurse Practitioner

## 2017-04-29 ENCOUNTER — Encounter: Payer: Self-pay | Admitting: Nurse Practitioner

## 2017-04-29 VITALS — BP 98/60 | HR 68 | Resp 12 | Ht 66.0 in | Wt 112.0 lb

## 2017-04-29 DIAGNOSIS — R350 Frequency of micturition: Secondary | ICD-10-CM

## 2017-04-29 DIAGNOSIS — B3731 Acute candidiasis of vulva and vagina: Secondary | ICD-10-CM

## 2017-04-29 DIAGNOSIS — B373 Candidiasis of vulva and vagina: Secondary | ICD-10-CM

## 2017-04-29 LAB — POCT URINALYSIS DIPSTICK
BILIRUBIN UA: NEGATIVE
GLUCOSE UA: NEGATIVE
KETONES UA: NEGATIVE
Leukocytes, UA: NEGATIVE
Nitrite, UA: NEGATIVE
Protein, UA: NEGATIVE
RBC UA: NEGATIVE
UROBILINOGEN UA: 0.2 U/dL
pH, UA: 7 (ref 5.0–8.0)

## 2017-04-29 MED ORDER — FLUCONAZOLE 150 MG PO TABS: 150.0000 mg | ORAL_TABLET | Freq: Once | ORAL | 0 refills | Status: AC

## 2017-04-29 MED ORDER — NYSTATIN 100000 UNIT/GM EX CREA: TOPICAL_CREAM | CUTANEOUS | 0 refills | Status: DC

## 2017-04-29 MED ORDER — TRIAMCINOLONE ACETONIDE 0.025 % EX OINT: 1.0000 "application " | TOPICAL_OINTMENT | Freq: Two times a day (BID) | CUTANEOUS | 0 refills | Status: DC

## 2017-04-29 NOTE — Progress Notes (Signed)
61 y.o. Married Caucasian female G0P0000 here with complaint of vaginal symptoms of itching. She also has a rash near rectal area. Has used Cetaphil and miconazole in past 2-3 weeks.  She has tried probiotic oral and intravaginal without help.  She has not changed any personal products.    Onset of symptoms 1 month ago.    No STD concerns. Urinary symptoms consists of frequency, especially at night . Contraception is Post-menopausal.  Past Medical History:  Diagnosis Date  . 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 Dr Sharol Roussel  . Atrophic vaginitis   . Diverticulosis 3/09  . Enteritis, candida 2011  . Gluten intolerance 2012  . H/O bilateral breast implants   . Melanoma ([place]) 2001   back of neck  . PMB (postmenopausal bleeding) 09/2012   On HRT - PUS/ SHGM with fibroids    Past Surgical History:  Procedure Laterality Date  . BREAST ENHANCEMENT SURGERY Bilateral 30 years ago   silicone, replaced with saline 6 years ago  . MELANOMA EXCISION  2001   back of neck  . MYOMECTOMY  about 1987   for fibroids     Current Outpatient Prescriptions:  .  Amino Acids (AMINO ACID PO), Take by mouth., Disp: , Rfl:  .  CALCIUM PO, Take by mouth., Disp: , Rfl:  .  Cholecalciferol (VITAMIN D PO), Take 100 mg by mouth 3 (three) times daily., Disp: , Rfl:  .  Iodine Strong, Lugols, (IODINE STRONG PO), Take by mouth., Disp: , Rfl:  .  Magnesium 200 MG TABS, Take 1 tablet by mouth daily. , Disp: , Rfl:  .  MINIVELLE 0.0375 MG/24HR, Place 1 patch onto the skin 2 (two) times a week., Disp: , Rfl:  .  Nutritional Supplements (DHEA PO), Take 10 mg by mouth daily., Disp: , Rfl:  .  Omega-3 Fatty Acids (FISH OIL) 600 MG CAPS, Take by mouth., Disp: , Rfl:  .  Probiotic Product (PROBIOTIC DAILY PO), Take by mouth., Disp: , Rfl:  .  progesterone (PROMETRIUM) 100 MG capsule, Take 100 mg by mouth 2 (two) times daily. , Disp: , Rfl:  .  Testosterone 75 MG PLLT, Inject into the skin., Disp: , Rfl:  .   vitamin B-12 (CYANOCOBALAMIN) 1000 MCG tablet, Take 1,000 mcg by mouth daily., Disp: , Rfl:   ALLERGIES: Cephalexin  O:  Healthy female WDWN Affect: normal, orientation x 3  Physical Exam:  BP 98/60 (BP Location: Right Arm, Patient Position: Sitting, Cuff Size: Normal)   Pulse 68   Resp 12   Ht 5\' 6"  (1.676 m)   Wt 112 lb (50.8 kg)   LMP 12/15/2006   BMI 18.08 kg/m   UA:  negative  General appearance: alert, cooperative and appears stated age Abdomen: Lymph node: no enlargement or tenderness Pelvic exam: External genital: normal female BUS: negative Vagina: white discharge noted.  Perirectal and rectal area:  Areas of redness that are consistent with yeast.  There is a small rectal hemorrhoid without bleeding.   A: Vaginitis - most consistent with yeast and radiation to perianal and anal areas.  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 Discussed Aveeno or baking soda sitz bath for comfort. Avoid moist clothes or pads for extended period of time. If working out in gym clothes or swim suits for long periods of time change underwear or bottoms of swimsuit  if possible. Olive Oil/Coconut Oil use for skin protection prior to activity can be used to external skin.  Rx: she is given Triamcinolone and Nystatin to use to affected areas BID.  She is given instructions for mixing the 2 creams  Rx: for Diflucan 150 mg   Continue to monitor urine symptoms    RV prn

## 2017-04-29 NOTE — Progress Notes (Signed)
Encounter reviewed Kinnley Paulson, MD   

## 2017-04-29 NOTE — Patient Instructions (Signed)

## 2017-10-26 ENCOUNTER — Ambulatory Visit: Payer: BLUE CROSS/BLUE SHIELD | Admitting: Nurse Practitioner

## 2018-01-29 ENCOUNTER — Ambulatory Visit: Payer: Self-pay | Admitting: Nurse Practitioner

## 2018-02-03 ENCOUNTER — Ambulatory Visit (INDEPENDENT_AMBULATORY_CARE_PROVIDER_SITE_OTHER): Payer: Self-pay | Admitting: Certified Nurse Midwife

## 2018-02-03 ENCOUNTER — Encounter: Payer: Self-pay | Admitting: Certified Nurse Midwife

## 2018-02-03 ENCOUNTER — Other Ambulatory Visit: Payer: Self-pay

## 2018-02-03 VITALS — BP 110/62 | HR 68 | Resp 16 | Ht 65.75 in | Wt 111.0 lb

## 2018-02-03 DIAGNOSIS — N951 Menopausal and female climacteric states: Secondary | ICD-10-CM

## 2018-02-03 DIAGNOSIS — Z79890 Hormone replacement therapy (postmenopausal): Secondary | ICD-10-CM

## 2018-02-03 DIAGNOSIS — Z01419 Encounter for gynecological examination (general) (routine) without abnormal findings: Secondary | ICD-10-CM

## 2018-02-03 DIAGNOSIS — N898 Other specified noninflammatory disorders of vagina: Secondary | ICD-10-CM

## 2018-02-03 NOTE — Progress Notes (Signed)
62 y.o. G0P0000 Married  Caucasian Fe here for annual exam. Menopausal no HRT. Has GI issues in the past and was on medication with Dr. Sharol Roussel and now will be seeing Dr. Berneta Levins for management of hormones, will be having labs there also. Denies vaginal bleeding. Has vaginal dryness and was using cream from Dr. Sharol Roussel which was hormonal, and decided to stop using. Aware she is due for mammogram and will call and schedule. Still working on gaining weight. Eating better now, with no gluten. Has umbilical hernia, has noted no change. Sees Dermatology yearly due to history of melanoma. No other health issues today.  Patient's last menstrual period was 12/15/2006.          Sexually active: Yes.    The current method of family planning is post menopausal status.    Exercising: Yes.    walking Smoker:  no  Health Maintenance: Pap:  10-12-14 neg HPV HR neg, 01-17-17 neg History of Abnormal Pap: yes, history of cryo MMG:  10/31/15 neg, 03-27-16 rt  Breast u/s birads 1:neg, but referred to plastic surgeon for capsulitis.  Self Breast exams: no Colonoscopy:  2008 normal declines referral at this time BMD:   2017 normal per patient TDaP:  declined Shingles: no Pneumonia: no Hep C and HIV: Hep c neg 2016, HIV neg 2010 Labs: neg.   reports that  has never smoked. she has never used smokeless tobacco. She reports that she does not drink alcohol or use drugs.  Past Medical History:  Diagnosis Date  . 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 Dr Sharol Roussel  . Atrophic vaginitis   . Diverticulosis 3/09  . Enteritis, candida 2011  . Gluten intolerance 2012  . H/O bilateral breast implants   . Melanoma ([place]) 2001   back of neck  . PMB (postmenopausal bleeding) 09/2012   On HRT - PUS/ SHGM with fibroids    Past Surgical History:  Procedure Laterality Date  . BREAST ENHANCEMENT SURGERY Bilateral 30 years ago   silicone, replaced with saline 6 years ago  . MELANOMA EXCISION  2001   back of  neck  . MYOMECTOMY  about 1987   for fibroids    Current Outpatient Medications  Medication Sig Dispense Refill  . Amino Acids (AMINO ACID PO) Take by mouth.    Marland Kitchen CALCIUM PO Take by mouth.    . Cholecalciferol (VITAMIN D PO) Take 1,000 mg by mouth. 2 drops daily    . Iodine Strong, [place], (IODINE STRONG PO) Take by mouth.    . Magnesium 200 MG TABS Take 1 tablet by mouth daily.     Marland Kitchen MINIVELLE 0.0375 MG/24HR Place 1 patch onto the skin 2 (two) times a week.    Marland Kitchen NATURE-THROID 32.5 MG tablet TK 1 T PO ON AN EMPTY STOMACH ONE HOUR BEFORE FOOD OR COFFEE  10  . Nutritional Supplements (DHEA PO) Take 10 mg by mouth daily.    . Omega-3 Fatty Acids (FISH OIL) 600 MG CAPS Take by mouth.    . Probiotic Product (PROBIOTIC DAILY PO) Take by mouth.    . progesterone (PROMETRIUM) 100 MG capsule Take 100 mg by mouth 2 (two) times daily.     . vitamin B-12 (CYANOCOBALAMIN) 1000 MCG tablet Take 1,000 mcg by mouth daily.     No current facility-administered medications for this visit.     Family History  Problem Relation Age of Onset  . Hypertension Mother   . Hyperlipidemia Mother   .  Cancer Father     ROS:  Pertinent items are noted in HPI.  Otherwise, a comprehensive ROS was negative.  Exam:   BP 110/62   Pulse 68   Resp 16   Ht 5' 5.75" (1.67 m)   Wt 111 lb (50.3 kg)   LMP 12/15/2006   BMI 18.05 kg/m  Height: 5' 5.75" (167 cm) Ht Readings from Last 3 Encounters:  02/03/18 5' 5.75" (1.67 m)  04/29/17 5\' 6"  (1.676 m)  01/27/17 5\' 6"  (1.676 m)    General appearance: alert, cooperative and appears stated age Head: Normocephalic, without obvious abnormality, atraumatic Neck: no adenopathy, supple, symmetrical, trachea midline and thyroid normal to inspection and palpation Lungs: clear to auscultation bilaterally Breasts: normal appearance, no masses or tenderness, No nipple retraction or dimpling, No nipple discharge or bleeding, No axillary or supraclavicular adenopathy  Saline  implants feel intact. Heart: regular rate and rhythm Abdomen: soft, non-tender; no masses,  no organomegaly. Umbilical area above area on right small hernia noted with cough only. Does not extend, non tender. Extremities: extremities normal, atraumatic, no cyanosis or edema Skin: Skin color, texture, turgor normal. No rashes or lesions Lymph nodes: Cervical, supraclavicular, and axillary nodes normal. No abnormal inguinal nodes palpated Neurologic: Grossly normal   Pelvic: External genitalia:  no lesions              Urethra:  normal appearing urethra with no masses, tenderness or lesions              Bartholin's and Skene's: normal                 Vagina: normal appearing vagina with normal color and discharge, no lesions              Cervix: no cervical motion tenderness, no lesions and normal appearance              Pap taken: No. Bimanual Exam:  Uterus:  normal size, contour, position, consistency, mobility, non-tender and retroverted              Adnexa: normal adnexa and no mass, fullness, tenderness               Rectovaginal: Confirms               Anus:  normal sphincter tone, no lesions  Chaperone present: yes  A:  Well Woman with normal exam  Menopausal on HRT/Nature thyroid with Dr. Sharol Roussel Dr. Dalbert Batman now  Vaginal dryness  Small umbilical hernia, not symptomatic  Screening labs to be done with Dr. Dalbert Batman  Mammogram due, patient only has [place] yearly at [facility], she will schedule    P:   Reviewed health and wellness pertinent to exam  Discussed need to advise if vaginal bleeding. Discussed risks/benefits/warnings of HRT and need to make sure she does have follow up with MD.  Discussed OTC coconut oil use for dryness, with instructions given. Patient will advise if no change  Discussed hernia( she was aware of) and warning signs, that would need to be evaluated for. Patient voiced understanding. Also offered surgical referral for opinion, declined.  Pap smear: no  counseled  on breast self exam, mammography screening, feminine hygiene, menopause, adequate intake of calcium and vitamin D, diet and exercise return annually or prn  An After Visit Summary was printed and given to the patient.

## 2018-02-03 NOTE — Patient Instructions (Signed)

## 2018-06-15 ENCOUNTER — Ambulatory Visit (INDEPENDENT_AMBULATORY_CARE_PROVIDER_SITE_OTHER): Payer: Self-pay | Admitting: Certified Nurse Midwife

## 2018-06-15 ENCOUNTER — Other Ambulatory Visit: Payer: Self-pay

## 2018-06-15 ENCOUNTER — Telehealth: Payer: Self-pay | Admitting: Certified Nurse Midwife

## 2018-06-15 ENCOUNTER — Encounter: Payer: Self-pay | Admitting: Certified Nurse Midwife

## 2018-06-15 VITALS — BP 106/64 | HR 70 | Temp 97.4°F | Resp 16 | Ht 65.75 in | Wt 107.0 lb

## 2018-06-15 DIAGNOSIS — B3731 Acute candidiasis of vulva and vagina: Secondary | ICD-10-CM

## 2018-06-15 DIAGNOSIS — N898 Other specified noninflammatory disorders of vagina: Secondary | ICD-10-CM

## 2018-06-15 DIAGNOSIS — R319 Hematuria, unspecified: Secondary | ICD-10-CM

## 2018-06-15 DIAGNOSIS — B373 Candidiasis of vulva and vagina: Secondary | ICD-10-CM

## 2018-06-15 DIAGNOSIS — R350 Frequency of micturition: Secondary | ICD-10-CM

## 2018-06-15 DIAGNOSIS — N390 Urinary tract infection, site not specified: Secondary | ICD-10-CM

## 2018-06-15 LAB — POCT URINALYSIS DIPSTICK
Bilirubin, UA: NEGATIVE
Glucose, UA: NEGATIVE
KETONES UA: NEGATIVE
Leukocytes, UA: NEGATIVE
NITRITE UA: NEGATIVE
PH UA: 5 (ref 5.0–8.0)
PROTEIN UA: NEGATIVE
UROBILINOGEN UA: NEGATIVE U/dL — AB

## 2018-06-15 MED ORDER — FLUCONAZOLE 150 MG PO TABS: ORAL_TABLET | ORAL | 0 refills | Status: DC

## 2018-06-15 NOTE — Patient Instructions (Signed)

## 2018-06-15 NOTE — Progress Notes (Signed)
62 y.o. Married Caucasian female G0P0000 here with complaint of UTI, with onset  on several weeks ago noted frequency and has been increased and in last 24 hours frequency and urgency have increased. Denies pain with urination.  Patient complaining of urinary frequency/urgency only . Patient had slight ? Fever(did not take temperature) several days ago, chills on occasion.,  No nausea or back pain. No new personal products. Patient feels not related to sexual activity. Denies any vaginal symptoms, but wonders if she has yeast, due to vaginal feeling.  Menopausal with some vaginal dryness. Patient drinking  adequate water intake. Patient has been collecting stool specimen for 3 days, and was called with positive for blood by PCP. Has appointment to discuss and set evaluation. Request name of female GI.  No other health issues today.  Review of Systems  Constitutional: Positive for chills, fever and weight loss.  HENT: Negative.   Eyes: Negative.   Respiratory: Negative.   Cardiovascular: Negative.   Gastrointestinal: Negative.   Genitourinary: Positive for frequency and urgency.       Loss of urine with cough/sneeze, night urination  Musculoskeletal: Negative.   Skin: Negative.   Neurological: Negative.   Endo/Heme/Allergies: Negative.   Psychiatric/Behavioral: Negative.     O: Healthy female WDWN Affect: Normal, orientation x 3 Skin : warm and dry CVAT: negative bilateral Abdomen: negative for suprapubic tenderness  Pelvic exam: External genital area: normal, no lesions Bladder,Urethra non tender, Urethral meatus: slight tenderness and increase pink, Vagina: Some vaginal dryness at introitus and white vaginal discharge, normal appearance  Wet prep taken Cervix: normal, non tender Uterus:normal,non tender Adnexa: normal non tender, no fullness or masses Rectal area: no lesions  Wet prep: Koh, saline + for yeast, occasional red blood cell   A:Normal pelvic exam Yeast vaginitis R/O  UTI Vaginal dryness Positive blood in stool with PCP, given Dr. Collene Mares information  poct urine- rbc tr  P: Reviewed findings of yeast vaginitis and need for treatment. Discussed vaginal dryness can also increase risk of yeast and UTI symptoms of frequency. Discussed OTC options for dryness, patient prefers Olive oil and will increase use at night. Will send urine out to make sure no UTI. Patient will call if symptoms change. EH:[id number] see order with instructions OVZ:CHYIF micro, culture Reviewed warning signs and symptoms of UTI and need to advise if occurring. Encouraged to limit soda, tea, and coffee and be sure to increase water intake.   RV prn

## 2018-06-15 NOTE — Telephone Encounter (Signed)
Patient called requesting an appointment today for a possible urinary tract infection.

## 2018-06-15 NOTE — Telephone Encounter (Signed)
Encounter closed

## 2018-06-15 NOTE — Telephone Encounter (Signed)
Returned call to patient. Per Regina Eck medical assistant, okay to work patient in at 11:30 AM this morning. Patient is on the way to the office.

## 2018-06-16 LAB — URINALYSIS, MICROSCOPIC ONLY: Casts: NONE SEEN /lpf

## 2018-06-16 LAB — URINE CULTURE: Organism ID, Bacteria: NO GROWTH

## 2018-06-18 ENCOUNTER — Telehealth: Payer: Self-pay | Admitting: Certified Nurse Midwife

## 2018-06-18 NOTE — Telephone Encounter (Signed)
Patient called and stated that she spoke with Joy earlier. She stated that blood was detected in her urine, but that it was not a UTI. Patient wondering how she could have blood in her urine and not have a UTI.

## 2018-06-18 NOTE — Telephone Encounter (Signed)
Spoke with patient. Results given. Patient verbalizes understanding. States that she is feeling "hot all over." Advised testing for UTI was negative. Patient did take medication for yeast. Denies fever or chills. Advised if she continues not to feel well should seek evaluation with her PCP. Patient verbalizes understanding and will call her PCP if she continues not to feel well.  Notes recorded by Regina Eck, CNM on 06/16/2018 at 10:05 PM EDT Notify patient that her urine micro showed no significant bacteria or abnormal RBC Urine culture showed no bacteria growth All yeast related  Routing to provider for final review. Patient agreeable to disposition. Will close encounter.

## 2018-06-18 NOTE — Telephone Encounter (Signed)
Patient requesting results from Tuesday. Patient stated that "her body is feeling hot, and she is thinking that she might need an antibiotic."

## 2018-06-18 NOTE — Telephone Encounter (Signed)
Spoke with patient. Advised urine dip showed trace blood which is why urine micro was sent out. Urine micro is more accurate and detailed testing for blood cells in urine. Advised this returned normal indicating no blood in the urine. Patient verbalizes understanding/  Routing to provider for final review. Patient agreeable to disposition. Will close encounter.

## 2019-02-09 ENCOUNTER — Encounter: Payer: Self-pay | Admitting: Certified Nurse Midwife

## 2019-02-09 ENCOUNTER — Telehealth: Payer: Self-pay | Admitting: *Deleted

## 2019-02-09 ENCOUNTER — Ambulatory Visit (INDEPENDENT_AMBULATORY_CARE_PROVIDER_SITE_OTHER): Payer: Self-pay | Admitting: Certified Nurse Midwife

## 2019-02-09 ENCOUNTER — Other Ambulatory Visit: Payer: Self-pay

## 2019-02-09 ENCOUNTER — Other Ambulatory Visit (HOSPITAL_COMMUNITY)
Admission: RE | Admit: 2019-02-09 | Discharge: 2019-02-09 | Disposition: A | Discharge status: Home / Self Care | Payer: BLUE CROSS/BLUE SHIELD | Source: Ambulatory Visit | Attending: Certified Nurse Midwife | Admitting: Certified Nurse Midwife

## 2019-02-09 VITALS — BP 120/64 | HR 68 | Resp 16 | Ht 65.75 in | Wt 116.0 lb

## 2019-02-09 DIAGNOSIS — Z124 Encounter for screening for malignant neoplasm of cervix: Secondary | ICD-10-CM | POA: Insufficient documentation

## 2019-02-09 DIAGNOSIS — N6310 Unspecified lump in the right breast, unspecified quadrant: Secondary | ICD-10-CM

## 2019-02-09 DIAGNOSIS — N898 Other specified noninflammatory disorders of vagina: Secondary | ICD-10-CM

## 2019-02-09 DIAGNOSIS — Z01411 Encounter for gynecological examination (general) (routine) with abnormal findings: Secondary | ICD-10-CM

## 2019-02-09 NOTE — Telephone Encounter (Signed)
Spoke with patient while in office. Bilateral Dx MMG recommended for further evaluation of pea size mass x2 outer edge of right breast and breast pain. Patient has bilateral breast implants, declines MMG, will proceed with bilateral breast US. Last imaging right breast US 03/27/2016 at [facility]. Call placed to [facility], patient care coordinator at [facility], Left message to call Sharee Pimple, RN at [place].  Patient request afternoon appointment after 3pm, no wednesdays. Advised patient I will f/u with her with appt details once scheduled. Patient verbalizes understanding and is agreeable. See telephone encounter dated 02/09/19.

## 2019-02-09 NOTE — Patient Instructions (Signed)

## 2019-02-09 NOTE — Progress Notes (Signed)
63 y.o. G0P0000 Married  Caucasian Fe here for annual exam. Post menopausal on HRT, working well, no vaginal bleeding. Has been seeing Dr. Dalbert Batman for evaluation and treatment of HRT and supplement recommendation. Went ahead and had colonoscopy with Dr. Collene Mares and had diverticula, but otherwise normal. Has been able to add bread and slight amounts of sugar to diet with Dr. Collene Mares advising as needed. Has been able to gain 10 pounds and feeling so much better.Marland Kitchen Has not had recent mammogram due implant previous rupture with mammogram, but did have [place]. No other health issues today.  Patient's last menstrual period was 12/15/2006.          Sexually active: Yes.    The current method of family planning is post menopausal status.    Exercising: Yes.    walking Smoker:  no  Review of Systems  Constitutional: Negative.   HENT: Negative.   Eyes: Negative.   Respiratory: Negative.   Cardiovascular: Negative.   Gastrointestinal: Negative.   Genitourinary: Negative.   Musculoskeletal: Negative.   Skin:       Vaginal discharge  Neurological: Negative.   Endo/Heme/Allergies: Negative.   Psychiatric/Behavioral: Negative.     Health Maintenance: Pap:  01-17-17 neg History of Abnormal Pap: history of cryo MMG:  10-31-15 neg, 03-27-16 rt breast u/s birads 1:neg, but referred to plastic surgeon for capsulitis. Self Breast exams: no Colonoscopy:  06/2018 BMD:   2017 normal per patient TDaP:  declined Shingles: no Pneumonia: no Hep C and HIV: Hep c neg 2016, HIV neg 2010 Labs: Dr. Dalbert Batman   reports that she has never smoked. She has never used smokeless tobacco. She reports that she does not drink alcohol or use drugs.  Past Medical History:  Diagnosis Date  . Abnormal Pap smear of cervix    over 30 yrs ago  . 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 Dr Sharol Roussel  . Atrophic vaginitis   . Diverticulosis 3/09  . Enteritis, candida 2011  . Gluten intolerance 2012  . H/O bilateral breast implants    . Melanoma ([place]) 2001   back of neck  . PMB (postmenopausal bleeding) 09/2012   On HRT - PUS/ SHGM with fibroids    Past Surgical History:  Procedure Laterality Date  . BREAST ENHANCEMENT SURGERY Bilateral 30 years ago   silicone, replaced with saline 6 years ago  . CRYOTHERAPY     over 3yrs ago for abnormal pap smear  . MELANOMA EXCISION  2001   back of neck  . MYOMECTOMY  about 1987   for fibroids    Current Outpatient Medications  Medication Sig Dispense Refill  . Amino Acids (AMINO ACID PO) Take by mouth.    . Cholecalciferol (VITAMIN D PO) Take 1,000 mg by mouth. 2 drops daily    . COLLAGEN PO Take by mouth.    . fluconazole (DIFLUCAN) 150 MG tablet Take one tablet.  Repeat the second tablet in 5 days 2 tablet 0  . MINIVELLE 0.0375 MG/24HR Place 1 patch onto the skin 2 (two) times a week.    Marland Kitchen NATURE-THROID 32.5 MG tablet TK 1 T PO ON AN EMPTY STOMACH ONE HOUR BEFORE FOOD OR COFFEE  10  . Nutritional Supplements (DHEA PO) Take 10 mg by mouth daily.    . Omega-3 Fatty Acids (FISH OIL) 600 MG CAPS Take by mouth.    . Probiotic Product (PROBIOTIC DAILY PO) Take by mouth.    . progesterone (PROMETRIUM) 100 MG capsule Take  100 mg by mouth 2 (two) times daily.      No current facility-administered medications for this visit.     Family History  Problem Relation Age of Onset  . Hypertension Mother   . Hyperlipidemia Mother   . Cancer Father     ROS:  Pertinent items are noted in HPI.  Otherwise, a comprehensive ROS was negative.  Exam:   LMP 12/15/2006    Ht Readings from Last 3 Encounters:  06/15/18 5' 5.75" (1.67 m)  02/03/18 5' 5.75" (1.67 m)  04/29/17 5\' 6"  (1.676 m)    General appearance: alert, cooperative and appears stated age Head: Normocephalic, without obvious abnormality, atraumatic Neck: no adenopathy, supple, symmetrical, trachea midline and thyroid normal to inspection and palpation Lungs: clear to auscultation bilaterally Breasts: normal  appearance, no masses or tenderness, No nipple retraction or dimpling, No nipple discharge or bleeding, No axillary or supraclavicular adenopathy, right breast pea size mass at upper outer edge of breast at 10 o'clock slightly tender, has implants feel intact. Heart: regular rate and rhythm Abdomen: soft, non-tender; no masses,  no organomegaly Extremities: extremities normal, atraumatic, no cyanosis or edema Skin: Skin color, texture, turgor normal. No rashes or lesions Lymph nodes: Cervical, supraclavicular, and axillary nodes normal. No abnormal inguinal nodes palpated Neurologic: Grossly normal   Pelvic: External genitalia:  no lesions              Urethra:  normal appearing urethra with no masses, tenderness or lesions              Bartholin's and Skene's: normal                 Vagina: normal appearing vagina with normal color and increase  Discharge noted, no lesions              Cervix: no bleeding following Pap, no cervical motion tenderness and no lesions              Pap taken: Yes.   Bimanual Exam:  Uterus:  normal size, contour, position, consistency, mobility, non-tender and anteverted              Adnexa: normal adnexa and no mass, fullness, tenderness               Rectovaginal: Confirms               Anus:  normal sphincter tone, no lesions  Chaperone present: yes  A:  Well Woman with normal exam  Menopausal on HRT with Dr. Dalbert Batman  Right breast mass ? Lymph node enlargement  Hypothyroid with MD management  R/O vaginal infection  P:   Reviewed health and wellness pertinent to exam  Discussed need to advise if vaginal bleeding.  Discussed right breast mass finding and need to evaluate, patient agreeable to [place] bilateral only. Will schedule prior to leaving today.  Continue follow up as indicated with MD  Lab: affirm  Pap smear: yes   counseled on breast self exam, mammography screening, feminine hygiene, adequate intake of calcium and vitamin D, diet and  exercise  return annually or prn  An After Visit Summary was printed and given to the patient.

## 2019-02-09 NOTE — Progress Notes (Signed)
Spoke with patient while in office. Bilateral Dx MMG recommended for further evaluation of pea size mass x2 outer edge of right breast and breast pain. Patient has bilateral breast implants, declines MMG, will proceed with bilateral breast US. Last imaging right breast US 03/27/2016 at [place]. Call placed to [facility], patient care coordinator at [facility], Left message to call Sharee Pimple, RN at [place].  Patient request afternoon appointment after 3pm, no wednesdays. Advised patient I will f/u with her with appt details once scheduled. Patient verbalizes understanding and is agreeable. See telephone encounter dated 02/09/19.

## 2019-02-10 LAB — VAGINITIS/VAGINOSIS, DNA PROBE
Candida Species: NEGATIVE
Gardnerella vaginalis: NEGATIVE
Trichomonas vaginosis: NEGATIVE

## 2019-02-10 NOTE — Telephone Encounter (Signed)
Spoke with Janett Billow. States patient was referred to plastic surgery after her last imaging 03/2016, asking if patient f/u?   Per review of Care everywhere, 07/07/16 revision augmentation, right capsulectomy at [facility]. Janett Billow states she will have to review with radiologist prior to scheduling, will return call.

## 2019-02-10 NOTE — Telephone Encounter (Signed)
Left message to call Khylen Riolo, RN at [facility] [phone].   

## 2019-02-10 NOTE — Telephone Encounter (Signed)
Spoke with patient, advised of appointment as seen below. Patient verbalizes understanding and is agreeable.  Routing to provider for final review. Patient is agreeable to disposition. Will close encounter.  

## 2019-02-10 NOTE — Telephone Encounter (Addendum)
Spoke with Janett Billow at [place]. Patient scheduled for bilateral breast US on 3/5 at 1pm. Dr. Isaiah Blakes will be in office to speak with patient prior to exam.   Order faxed to [facility].

## 2019-02-10 NOTE — Telephone Encounter (Signed)
Janett Billow at [place] called requesting to speak with nurse Sharee Pimple.

## 2019-02-11 LAB — CYTOLOGY - PAP
Adequacy: ABSENT
Diagnosis: NEGATIVE
HPV: NOT DETECTED

## 2019-03-03 ENCOUNTER — Telehealth: Payer: Self-pay | Admitting: Certified Nurse Midwife

## 2019-03-03 NOTE — Telephone Encounter (Signed)
Routing to Melvia Heaps, CNM to review and advised on HRT.

## 2019-03-03 NOTE — Telephone Encounter (Signed)
I would suggest she try to wean off at this point to see if this resolves the problem by her follow up mammogram. She can use one weekly for 2-3 weeks and then stop.

## 2019-03-03 NOTE — Telephone Encounter (Addendum)
Patient had breast ultrasound and mammogram at [facility] on 02/17/19. She has cut her hormones in half due to "breast changes" and wants to be sure this is the best thing to do.  Last seen: 02/09/19

## 2019-03-03 NOTE — Telephone Encounter (Signed)
Spoke with patient, advised as seen below per Melvia Heaps, CNM. Patient has been cutting her patches in half, reduced Prometrium to 100 mg daily and DHEA to 5mg  daily for the past 2wks. Patient will continue medications for another wk and then stop, has been doing well. Patient verbalizes understanding and is agreeable.    Routing to provider for final review. Patient is agreeable to disposition. Will close encounter.

## 2019-03-23 ENCOUNTER — Encounter: Payer: Self-pay | Admitting: Certified Nurse Midwife

## 2019-09-01 ENCOUNTER — Encounter: Payer: Self-pay | Admitting: Certified Nurse Midwife

## 2020-02-14 ENCOUNTER — Ambulatory Visit: Payer: Self-pay | Admitting: Certified Nurse Midwife

## 2020-02-22 ENCOUNTER — Other Ambulatory Visit: Payer: Self-pay

## 2020-02-27 ENCOUNTER — Ambulatory Visit (INDEPENDENT_AMBULATORY_CARE_PROVIDER_SITE_OTHER): Payer: Self-pay | Admitting: Certified Nurse Midwife

## 2020-02-27 ENCOUNTER — Encounter: Payer: Self-pay | Admitting: Certified Nurse Midwife

## 2020-02-27 ENCOUNTER — Other Ambulatory Visit: Payer: Self-pay

## 2020-02-27 VITALS — BP 114/68 | HR 72 | Temp 98.4°F | Resp 16 | Ht 65.75 in | Wt 119.0 lb

## 2020-02-27 DIAGNOSIS — Z01411 Encounter for gynecological examination (general) (routine) with abnormal findings: Secondary | ICD-10-CM

## 2020-02-27 DIAGNOSIS — Z86018 Personal history of other benign neoplasm: Secondary | ICD-10-CM

## 2020-02-27 DIAGNOSIS — Z9882 Breast implant status: Secondary | ICD-10-CM

## 2020-02-27 DIAGNOSIS — N9489 Other specified conditions associated with female genital organs and menstrual cycle: Secondary | ICD-10-CM

## 2020-02-27 NOTE — Patient Instructions (Signed)
EXERCISE AND DIET:  We recommended that you start or continue a regular exercise program for good health. Regular exercise means any activity that makes your heart beat faster and makes you sweat.  We recommend exercising at least 30 minutes per day at least 3 days a week, preferably 4 or 5.  We also recommend a diet low in fat and sugar.  Inactivity, poor dietary choices and obesity can cause diabetes, heart attack, stroke, and kidney damage, among others.    ALCOHOL AND SMOKING:  Women should limit their alcohol intake to no more than 7 drinks/beers/glasses of wine (combined, not each!) per week. Moderation of alcohol intake to this level decreases your risk of breast cancer and liver damage. And of course, no recreational drugs are part of a healthy lifestyle.  And absolutely no smoking or even second hand smoke. Most people know smoking can cause heart and lung diseases, but did you know it also contributes to weakening of your bones? Aging of your skin?  Yellowing of your teeth and nails?  CALCIUM AND VITAMIN D:  Adequate intake of calcium and Vitamin D are recommended.  The recommendations for exact amounts of these supplements seem to change often, but generally speaking 600 mg of calcium (either carbonate or citrate) and 800 units of Vitamin D per day seems prudent. Certain women may benefit from higher intake of Vitamin D.  If you are among these women, your doctor will have told you during your visit.    PAP SMEARS:  Pap smears, to check for cervical cancer or precancers,  have traditionally been done yearly, although recent scientific advances have shown that most women can have pap smears less often.  However, every woman still should have a physical exam from her gynecologist every year. It will include a breast check, inspection of the vulva and vagina to check for abnormal growths or skin changes, a visual exam of the cervix, and then an exam to evaluate the size and shape of the uterus and  ovaries.  And after 64 years of age, a rectal exam is indicated to check for rectal cancers. We will also provide age appropriate advice regarding health maintenance, like when you should have certain vaccines, screening for sexually transmitted diseases, bone density testing, colonoscopy, mammograms, etc.   MAMMOGRAMS:  All women over 40 years old should have a yearly mammogram. Many facilities now offer a "3D" mammogram, which may cost around $50 extra out of pocket. If possible,  we recommend you accept the option to have the 3D mammogram performed.  It both reduces the number of women who will be called back for extra views which then turn out to be normal, and it is better than the routine mammogram at detecting truly abnormal areas.    COLONOSCOPY:  Colonoscopy to screen for colon cancer is recommended for all women at age 50.  We know, you hate the idea of the prep.  We agree, BUT, having colon cancer and not knowing it is worse!!  Colon cancer so often starts as a polyp that can be seen and removed at colonscopy, which can quite literally save your life!  And if your first colonoscopy is normal and you have no family history of colon cancer, most women don't have to have it again for 10 years.  Once every ten years, you can do something that may end up saving your life, right?  We will be happy to help you get it scheduled when you are ready.    Be sure to check your insurance coverage so you understand how much it will cost.  It may be covered as a preventative service at no cost, but you should check your particular policy.      Mammogram A mammogram is an X-ray of the breasts that is done to check for changes that are not normal. This test can screen for and find any changes that may suggest breast cancer. Mammograms are regularly done on women. A man may have a mammogram if he has a lump or swelling in his breast. This test can also help to find other changes and variations in the breast. Tell a  doctor:  About any allergies you have.  If you have breast implants.  If you have had previous breast disease, biopsy, or surgery.  If you are breastfeeding.  If you are younger than age 54.  If you have a family history of breast cancer.  Whether you are pregnant or may be pregnant. What are the risks? Generally, this is a safe procedure. However, problems may occur, including:  Exposure to radiation. Radiation levels are very low with this test.  The results being misinterpreted.  The need for further tests.  The inability of the mammogram to detect certain cancers. What happens before the procedure?  Have this test done about 1-2 weeks after your period. This is usually when your breasts are the least tender.  If you are visiting a new doctor or clinic, send any past mammogram images to your new doctor's office.  Wash your breasts and under your arms the day of the test.  Do not use deodorants, perfumes, lotions, or powders on the day of the test.  Take off any jewelry from your neck.  Wear clothes that you can change into and out of easily. What happens during the procedure?   You will undress from the waist up. You will put on a gown.  You will stand in front of the X-ray machine.  Each breast will be placed between two plastic or glass plates. The plates will press down on your breast for a few seconds. Try to stay as relaxed as possible. This does not cause any harm to your breasts. Any discomfort you feel will be very brief.  X-rays will be taken from different angles of each breast. The procedure may vary among doctors and hospitals. What happens after the procedure?  The mammogram will be read by a specialist (radiologist).  You may need to do certain parts of the test again. This depends on the quality of the images.  Ask when your test results will be ready. Make sure you get your test results.  You may go back to your normal  activities. Summary  A mammogram is a low energy X-ray of the breasts that is done to check for abnormal changes. A man may have this test if he has a lump or swelling in his breast.  Before the procedure, tell your doctor about any breast problems that you have had in the past.  Have this test done about 1-2 weeks after your period.  For the test, each breast will be placed between two plastic or glass plates. The plates will press down on your breast for a few seconds.  The mammogram will be read by a specialist (radiologist). Ask when your test results will be ready. Make sure you get your test results. This information is not intended to replace advice given to you by your health care  provider. Make sure you discuss any questions you have with your health care provider. Document Revised: 07/22/2018 Document Reviewed: 07/22/2018 Elsevier Patient Education  [place].

## 2020-02-27 NOTE — Progress Notes (Signed)
64 y.o. G0P0000 Married  Caucasian Fe here for annual exam.  Menopausal no ERT now, after last mammogram, taking Prometrium at hs only per Dr. Dalbert Batman who manages her ERT and hypothyroid. Feels this is not stable and had labs recently.. She has follow mammogram scheduled. Denies vaginal bleeding. Some vaginal dryness with sexual activity, using OTC lubrication with good result. Sees MD for labs, aex. Diverticulitis is stable with Dr. Collene Mares management. No other health issues today.  Patient's last menstrual period was 12/15/2006.          Sexually active: Yes.    The current method of family planning is post menopausal status.    Exercising: Yes.    twice a week Smoker:  no  Review of Systems  Constitutional: Negative.   HENT: Negative.   Eyes: Negative.   Respiratory: Negative.   Cardiovascular: Negative.   Gastrointestinal: Negative.   Genitourinary: Negative.   Musculoskeletal: Negative.   Skin: Negative.   Neurological: Negative.   Endo/Heme/Allergies: Negative.   Psychiatric/Behavioral: Negative.     Health Maintenance: Pap:  01-17-17 neg, 02-09-2019 neg HPV HR neg History of Abnormal Pap: hx of cryo MMG:  Bilateral & u/s 02/2019 category 3 probably benign f/u 46mths, 08/2019 rt breast u/s birads 2:neg Self Breast exams: occ Colonoscopy:  2019 BMD:   2017 neg per patient TDaP:  declined Shingles: not done Pneumonia: not done Hep C and HIV: hep c neg 2016, HIV neg 2020 per patient Labs: no   reports that she has never smoked. She has never used smokeless tobacco. She reports that she does not drink alcohol or use drugs.  Past Medical History:  Diagnosis Date  . Abnormal Pap smear of cervix    over 30 yrs ago  . 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 Dr Sharol Roussel  . Atrophic vaginitis   . Diverticulosis 3/09  . Enteritis, candida 2011  . Gluten intolerance 2012  . H/O bilateral breast implants   . Melanoma ([place]) 2001   back of neck  . PMB (postmenopausal bleeding)  09/2012   On HRT - PUS/ SHGM with fibroids    Past Surgical History:  Procedure Laterality Date  . BREAST ENHANCEMENT SURGERY Bilateral 30 years ago   silicone, replaced with saline 6 years ago  . CRYOTHERAPY     over 70yrs ago for abnormal pap smear  . MELANOMA EXCISION  2001   back of neck  . MYOMECTOMY  about 1987   for fibroids    Current Outpatient Medications  Medication Sig Dispense Refill  . Cholecalciferol (VITAMIN D PO) Take 1,000 mg by mouth. K2+D3    . COLLAGEN PO Take by mouth.    . fluconazole (DIFLUCAN) 200 MG tablet Take 200 mg by mouth daily. Take daily for 3-5 days    . MINIVELLE 0.0375 MG/24HR Place 1 patch onto the skin 2 (two) times a week.    Marland Kitchen NATURE-THROID 48.75 MG TABS     . Nutritional Supplements (DHEA PO) Take 10 mg by mouth daily.    . Omega-3 Fatty Acids (FISH OIL) 600 MG CAPS Take by mouth.    . Probiotic Product (PROBIOTIC DAILY PO) Take by mouth.    . progesterone (PROMETRIUM) 100 MG capsule Take 100 mg by mouth 2 (two) times daily.      No current facility-administered medications for this visit.    Family History  Problem Relation Age of Onset  . Hypertension Mother   . Hyperlipidemia Mother   .  Cancer Father     ROS:  Pertinent items are noted in HPI.  Otherwise, a comprehensive ROS was negative.  Exam:   LMP 12/15/2006    Ht Readings from Last 3 Encounters:  02/09/19 5' 5.75" (1.67 m)  06/15/18 5' 5.75" (1.67 m)  02/03/18 5' 5.75" (1.67 m)    General appearance: alert, cooperative and appears stated age Head: Normocephalic, without obvious abnormality, atraumatic Neck: no adenopathy, supple, symmetrical, trachea midline and thyroid normal to inspection and palpation Lungs: clear to auscultation bilaterally Breasts: normal appearance, no masses or tenderness, No nipple retraction or dimpling, No nipple discharge or bleeding, No axillary or supraclavicular adenopathy, implants noted bilateral Heart: regular rate and  rhythm Abdomen: soft, non-tender; no masses,  no organomegaly Extremities: extremities normal, atraumatic, no cyanosis or edema Skin: Skin color, texture, turgor normal. No rashes or lesions Lymph nodes: Cervical, supraclavicular, and axillary nodes normal. No abnormal inguinal nodes palpated Neurologic: Grossly normal   Pelvic: External genitalia:  no lesions, normal appearance              Urethra:  normal appearing urethra with no masses, tenderness or lesions              Bartholin's and Skene's: normal                 Vagina: normal appearing vagina with normal color and discharge, no lesions              Cervix: no cervical motion tenderness, no lesions and normal appearance              Pap taken: No. Bimanual Exam:  Uterus:  normal size, contour, position, consistency, mobility, non-tender and mid position              Adnexa: normal adnexa and no mass, fullness, tenderness on left, Right adnexal mass noted, slight tenderness with palpation only,no guarding               Rectovaginal: Confirms               Anus:  normal sphincter tone, no lesions  Chaperone present: yes  A:  Well Woman with normal exam  Post menopausal on Prometrium with MD management, stopped ERT due to breast tenderness  Right adnexal mass  Hypothyroid with MD management  P:   Reviewed health and wellness pertinent to exam  Aware of need to advise if vaginal bleeding  Discussed finding and need for evaluation, previous history of fibroids with myomectomy. Discussed PUS to evaluate, patient agreeable. Patient will be called with appointment. Precautions with pelvic pain given.  Continue follow with MD regarding hypothyroid and GI issues.  Pap smear: no  counseled on breast self exam, mammography screening, feminine hygiene, use and side effects of HRT, adequate intake of calcium and vitamin D, diet and exercise return annually or prn  An After Visit Summary was printed and given to the patient.

## 2020-02-28 ENCOUNTER — Telehealth: Payer: Self-pay | Admitting: *Deleted

## 2020-02-28 DIAGNOSIS — N9489 Other specified conditions associated with female genital organs and menstrual cycle: Secondary | ICD-10-CM

## 2020-02-28 NOTE — Telephone Encounter (Signed)
Spoke with patient. PUS scheduled for 03/06/20 at 1:30pm, consult to follow at 2pm with Dr. Talbert Nan. Order placed. Patient verbalizes understanding and is agreeable.   Routing to provider for final review. Patient is agreeable to disposition. Will close encounter.  Cc; Dr. Talbert Nan, [facility] [place], [place]

## 2020-02-28 NOTE — Telephone Encounter (Signed)
-----   Message from Regina Eck, CNM sent at 02/27/2020  3:04 PM EDT ----- Right adnexal mass noted at exam, needs to have PUS to evaluateShe is self pay. She is aware she will be called for appointment.

## 2020-02-29 ENCOUNTER — Telehealth: Payer: Self-pay | Admitting: Obstetrics and Gynecology

## 2020-02-29 NOTE — Telephone Encounter (Signed)
Call placed to convey benefits for ultrasound. Spoke with the patient and conveyed the benefits. Patient understands/agreeable with the benefits. Appointment scheduled 03/06/20.

## 2020-03-01 ENCOUNTER — Other Ambulatory Visit: Payer: Self-pay

## 2020-03-06 ENCOUNTER — Ambulatory Visit (INDEPENDENT_AMBULATORY_CARE_PROVIDER_SITE_OTHER): Payer: Self-pay | Admitting: Obstetrics and Gynecology

## 2020-03-06 ENCOUNTER — Encounter: Payer: Self-pay | Admitting: Obstetrics and Gynecology

## 2020-03-06 ENCOUNTER — Ambulatory Visit (INDEPENDENT_AMBULATORY_CARE_PROVIDER_SITE_OTHER): Payer: Self-pay

## 2020-03-06 ENCOUNTER — Other Ambulatory Visit: Payer: Self-pay

## 2020-03-06 VITALS — BP 124/66 | HR 86 | Temp 98.4°F | Ht 65.75 in | Wt 118.6 lb

## 2020-03-06 DIAGNOSIS — N9489 Other specified conditions associated with female genital organs and menstrual cycle: Secondary | ICD-10-CM

## 2020-03-06 DIAGNOSIS — Z09 Encounter for follow-up examination after completed treatment for conditions other than malignant neoplasm: Secondary | ICD-10-CM

## 2020-03-06 NOTE — Progress Notes (Signed)
GYNECOLOGY  VISIT   HPI: 64 y.o.   Married White or Caucasian Not Hispanic or Latino  female   G0P0000 with Patient's last menstrual period was 12/15/2006.   here for  Ultrasound consult.   GYNECOLOGIC HISTORY: Patient's last menstrual period was 12/15/2006. Contraception:Pmp  Menopausal hormone therapy: yes, progesterone         OB History    Gravida  0   Para  0   Term  0   Preterm  0   AB  0   Living  0     SAB  0   TAB  0   Ectopic  0   Multiple  0   Live Births  0              Patient Active Problem List   Diagnosis Date Noted  . History of breast augmentation 04/07/2016  . LEUKOPENIA, CHRONIC 05/09/2009  . IRRITABLE BOWEL SYNDROME 04/24/2009  . RASH AND OTHER NONSPECIFIC SKIN ERUPTION 02/13/2009  . MELANOMA 01/12/2009  . GERD 01/12/2009  . DIVERTICULOSIS, COLON 01/12/2009  . DIARRHEA 01/12/2009    Past Medical History:  Diagnosis Date  . Abnormal Pap smear of cervix    over 30 yrs ago  . 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 Dr Sharol Roussel  . Atrophic vaginitis   . Diverticulosis 3/09  . Enteritis, candida 2011  . Gluten intolerance 2012  . H/O bilateral breast implants   . Melanoma ([place]) 2001   back of neck  . PMB (postmenopausal bleeding) 09/2012   On HRT - PUS/ SHGM with fibroids    Past Surgical History:  Procedure Laterality Date  . BREAST ENHANCEMENT SURGERY Bilateral 30 years ago   silicone, replaced with saline 6 years ago  . CRYOTHERAPY     over 83yrs ago for abnormal pap smear  . MELANOMA EXCISION  2001   back of neck  . MYOMECTOMY  about 1987   for fibroids    Current Outpatient Medications  Medication Sig Dispense Refill  . Cholecalciferol (VITAMIN D PO) Take 1,000 mg by mouth. K2+D3    . COLLAGEN PO Take by mouth.    . Multiple Vitamins-Minerals (ZINC PO) Take by mouth.    . Omega-3 Fatty Acids (FISH OIL) 600 MG CAPS Take by mouth.    . Probiotic Product (PROBIOTIC DAILY PO) Take by mouth.    .  progesterone (PROMETRIUM) 100 MG capsule Take 100 mg by mouth daily.     Marland Kitchen QUERCETIN PO Take by mouth.    . SELENIUM PO Take by mouth.    . thyroid (ARMOUR THYROID) 30 MG tablet TAKE 1 TABLET BY MOUTH EVERY DAY     No current facility-administered medications for this visit.     ALLERGIES: Cephalexin  Family History  Problem Relation Age of Onset  . Hypertension Mother   . Hyperlipidemia Mother   . Cancer Father     Social History   Socioeconomic History  . Marital status: Married    Spouse name: Not on file  . Number of children: 0  . Years of education: Not on file  . Highest education level: Not on file  Occupational History  . Not on file  Tobacco Use  . Smoking status: Never Smoker  . Smokeless tobacco: Never Used  Substance and Sexual Activity  . Alcohol use: No  . Drug use: No  . Sexual activity: Yes    Partners: Male    Birth control/protection: Post-menopausal  Other Topics Concern  . Not on file  Social History Narrative  . Not on file   Social Determinants of Health   Financial Resource Strain:   . Difficulty of Paying Living Expenses:   Food Insecurity:   . Worried About Charity fundraiser in the Last Year:   . Arboriculturist in the Last Year:   Transportation Needs:   . Film/video editor (Medical):   Marland Kitchen Lack of Transportation (Non-Medical):   Physical Activity:   . Days of Exercise per Week:   . Minutes of Exercise per Session:   Stress:   . Feeling of Stress :   Social Connections:   . Frequency of Communication with Friends and Family:   . Frequency of Social Gatherings with Friends and Family:   . Attends Religious Services:   . Active Member of Clubs or Organizations:   . Attends Archivist Meetings:   Marland Kitchen Marital Status:   Intimate Partner Violence:   . Fear of Current or Ex-Partner:   . Emotionally Abused:   Marland Kitchen Physically Abused:   . Sexually Abused:     Review of Systems  All other systems reviewed and are  negative.   PHYSICAL EXAMINATION:    BP 124/66   Pulse 86   Temp 98.4 F (36.9 C)   Ht 5' 5.75" (1.67 m)   Wt 118 lb 9.6 oz (53.8 kg)   LMP 12/15/2006   SpO2 97%   BMI 19.29 kg/m     General appearance: alert, cooperative and appears stated age  See full ultrasound report. Normal pelvic ultrasound.  ASSESSMENT Adnexal mass felt on exam. Normal pelvic ultrasound. Discussed that stool in her bowel can feel like a mass on exam    PLAN Routine f/u

## 2020-03-07 ENCOUNTER — Encounter: Payer: Self-pay | Admitting: Certified Nurse Midwife

## 2020-07-19 ENCOUNTER — Other Ambulatory Visit: Payer: Self-pay | Admitting: Gastroenterology

## 2020-07-19 DIAGNOSIS — R1033 Periumbilical pain: Secondary | ICD-10-CM

## 2020-07-25 ENCOUNTER — Ambulatory Visit
Admission: RE | Admit: 2020-07-25 | Discharge: 2020-07-25 | Disposition: A | Discharge status: Home / Self Care | Payer: No Typology Code available for payment source | Source: Ambulatory Visit | Attending: Gastroenterology | Admitting: Gastroenterology

## 2020-07-25 DIAGNOSIS — R1033 Periumbilical pain: Secondary | ICD-10-CM

## 2020-07-25 MED ORDER — IOPAMIDOL (ISOVUE-300) INJECTION 61%
100.0000 mL | Freq: Once | INTRAVENOUS | Status: AC | PRN
  Administered 2020-07-25: 100 mL via INTRAVENOUS

## 2020-07-31 ENCOUNTER — Other Ambulatory Visit: Payer: Self-pay

## 2020-10-02 ENCOUNTER — Encounter: Payer: Self-pay | Admitting: Obstetrics and Gynecology

## 2021-02-27 ENCOUNTER — Ambulatory Visit: Payer: Self-pay | Admitting: Obstetrics and Gynecology

## 2021-03-01 NOTE — Progress Notes (Signed)
GYNECOLOGY  VISIT  CC:   Urinary frequency  HPI: 65 y.o. G0P0000 Married White or Caucasian female here for uti.   Noticed a increase in frequency with urination but is also feeling "spacy" last Thursday and Friday. In the past when her mother got a bladder infection, she acted "out of it". Wants to make sure she doesn't have a bladder infection. In past has had yeast infections and wonders if that could be part of the issue. Notices rectal itching 1 week. Other symptoms are: dizzy and foggy, short of breath.  Thinks she may have had Covid about 1 month ago, did not test, but husband was sick with covid and she got sick right after he did.  GYNECOLOGIC HISTORY: Patient's last menstrual period was 12/15/2006. Contraception: post menopausal Menopausal hormone therapy: none  Patient Active Problem List   Diagnosis Date Noted  . History of breast augmentation 04/07/2016  . LEUKOPENIA, CHRONIC 05/09/2009  . IRRITABLE BOWEL SYNDROME 04/24/2009  . RASH AND OTHER NONSPECIFIC SKIN ERUPTION 02/13/2009  . MELANOMA 01/12/2009  . GERD 01/12/2009  . DIVERTICULOSIS, COLON 01/12/2009  . DIARRHEA 01/12/2009    Past Medical History:  Diagnosis Date  . Abnormal Pap smear of cervix    over 30 yrs ago  . 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 Dr Sharol Roussel  . Atrophic vaginitis   . Diverticulosis 3/09  . Enteritis, candida 2011  . Gluten intolerance 2012  . H/O bilateral breast implants   . Melanoma ([place]) 2001   back of neck  . PMB (postmenopausal bleeding) 09/2012   On HRT - PUS/ SHGM with fibroids    Past Surgical History:  Procedure Laterality Date  . BREAST ENHANCEMENT SURGERY Bilateral 30 years ago   silicone, replaced with saline 6 years ago  . CRYOTHERAPY     over 6yrs ago for abnormal pap smear  . MELANOMA EXCISION  2001   back of neck  . MYOMECTOMY  about 1987   for fibroids    MEDS:   Current Outpatient Medications on File Prior to Visit  Medication Sig  Dispense Refill  . Cholecalciferol (VITAMIN D PO) Take 1,000 mg by mouth. K2+D3    . COLLAGEN PO Take by mouth.    . ivermectin (STROMECTOL) 3 MG TABS tablet Take by mouth.    . Multiple Vitamins-Minerals (ZINC PO) Take by mouth.    . Probiotic Product (PROBIOTIC DAILY PO) Take by mouth.    . thyroid (ARMOUR) 30 MG tablet TAKE 1 TABLET BY MOUTH EVERY DAY     No current facility-administered medications on file prior to visit.    ALLERGIES: Cephalexin  Family History  Problem Relation Age of Onset  . Hypertension Mother   . Hyperlipidemia Mother   . Cancer Father      Review of Systems  Constitutional: Positive for fatigue.       Shortness of breath, weakness, dizzy, foggy  HENT: Negative.   Eyes: Negative.   Respiratory: Negative.   Cardiovascular: Negative.   Gastrointestinal: Negative.   Endocrine: Negative.   Genitourinary: Positive for frequency.       More vaginal moisture  Musculoskeletal: Negative.   Skin: Negative.   Allergic/Immunologic: Negative.   Neurological: Negative.   Hematological: Negative.   Psychiatric/Behavioral: Negative.     PHYSICAL EXAMINATION:    BP 120/70   Pulse 90   Resp 16   Wt 116 lb (52.6 kg)   LMP 12/15/2006   BMI 18.87 kg/m  Sa02= 96%  General appearance: alert, cooperative, no acute distress  CV:  Regular rate and rhythm Lungs:  clear to auscultation, no wheezes, rales or rhonchi, symmetric air entry   Pelvic: External genitalia:  no lesions              Urethra:  normal appearing urethra with no masses, tenderness or lesions              Bartholins and Skenes: normal                 Vagina: normal appearing vagina, thin tissue, atrophic              Cervix: no cervical motion tenderness              Bimanual Exam:  Uterus:  normal size, contour, position, consistency, mobility, non-tender              Adnexa: no mass, fullness, tenderness   Rectal: no mass, hemorrhoid noted              UA WNL Wet mount:  WNL Chaperone, Joy, CMA, was present for exam.  Assessment: GSM  Plan: Reassured that urine looked normal, will confirm with culture No evidence of yeast infection. 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 Information provided in AVS. Pt can discuss further with Dr. Talbert Nan at annual exam 04/2021. Encouraged to follow up with PCP with some of her other symptoms: foggy, dizzy, spacey, short of breath   20 minutes of total time was spent for this patient encounter, including preparation, face-to-face counseling with the patient and coordination of care, and documentation of the encounter.

## 2021-03-04 ENCOUNTER — Encounter: Payer: Self-pay | Admitting: Nurse Practitioner

## 2021-03-04 ENCOUNTER — Other Ambulatory Visit: Payer: Self-pay

## 2021-03-04 ENCOUNTER — Ambulatory Visit (INDEPENDENT_AMBULATORY_CARE_PROVIDER_SITE_OTHER): Payer: Medicare HMO | Admitting: Nurse Practitioner

## 2021-03-04 VITALS — BP 120/70 | HR 90 | Temp 98.7°F | Resp 16 | Wt 116.0 lb

## 2021-03-04 DIAGNOSIS — N952 Postmenopausal atrophic vaginitis: Secondary | ICD-10-CM | POA: Diagnosis not present

## 2021-03-04 DIAGNOSIS — R350 Frequency of micturition: Secondary | ICD-10-CM

## 2021-03-04 LAB — URINALYSIS, COMPLETE W/RFL CULTURE
Bacteria, UA: NONE SEEN /HPF
Bilirubin Urine: NEGATIVE
Glucose, UA: NEGATIVE
Hyaline Cast: NONE SEEN /LPF
Ketones, ur: NEGATIVE
Leukocyte Esterase: NEGATIVE
Nitrites, Initial: NEGATIVE
Protein, ur: NEGATIVE
RBC / HPF: NONE SEEN /HPF (ref 0–2)
Specific Gravity, Urine: 1.015 (ref 1.001–1.03)
pH: 6 (ref 5.0–8.0)

## 2021-03-04 NOTE — Patient Instructions (Signed)
Genitourinary Syndrome of Menopause (GSM)  Genitourinary Syndrome of menopause is a term that describes the spectrum of changes caused by the lack of estrogen in menopause. Common Signs and Symptoms Include: Vaginal dryness, irritation/burning/itching. Abnormal discharge, vaginal pressure, pain with sex, decreased sexual arousal/desire, difficulty achieving orgasm, pain with urination, urgency, incontinence of urine, recurrent bladder infections, urethral prolapse and others. Diagnosis can usually be made with history and pelvic exam. Other testing may be considered to rule out other potential abnormalities. Symptoms can be progressive and chronic. The goal of treatment is symptom relief.  There are several different approaches to improving symptoms:  Vaginal Moisturizers and lubricants: Glycerin Moisturizer (Replens), Silicone based products (Uberlube), water-based products (Restore Moisturizing Gel by [employer]), Hyaluronic Acid vaginal products (such as HYALO GYN), and natural oils such as Olive Oil, Almond Oil and Coconut Oil (Since coconut oil comes in solid preparations, you can form them into suppositories and insert into the vagina as needed). These products are Over-the-Counter (OTC) at Pharmacies and retail stores and DO NOT contain hormones.  How to use vaginal and vulvar moisturizers . Many vaginal moisturizers come with an applicator. You will need fill the applicator with the moisturizer and then insert it carefully into your vagina. You can put lubricant on the tip of the applicator to make it easier to insert into your vagina. . You can also use vaginal moisturizers on your vulvar tissues, including your inner and outer labia (the folds of skin around your vagina). To put these moisturizers on your vulva, put a small amount (pea or grape size) of moisturizer on your finger. Then, massage the moisturizer into your vaginal opening and onto your labia. . If you recently finished  cancer treatment, or are going through sudden menopause, you may need to use the moisturizers 3 to 5 times a week to relieve your symptoms. . Vaginal and vulvar moisturizers should be used before you go to bed, so the product can be fully absorbed.  Lifestyle modifications: smoking cessation, pelvic floor physical therapy, Kegel's exercises, vaginal dilators  Non-hormonal therapies: Lidocaine (topical anesthetic) to decrease sensitivity, Oral Ospemifeme (requires prescription), Laser therapy (Pros and Cons should be discussed with provider)  Hormones Therapies: For moderate to severe GSM, vaginal estrogen is considered the most effective treatment. It can be used in combination with vaginal moisturizers and lubricants. Estrogen is delivered in small doses in the vagina with various preparations available including creams, rings, and tablets. Estrogen therapy may be contraindicated with certain hormone sensitive cancers. Vaginal DHEA and Testosterone have shown effectiveness in some cases to help with relieving vaginal atrophy and have shown mixed results with libido. Risks and benefits should be discussed prior to starting therapy.  

## 2021-03-05 DIAGNOSIS — R350 Frequency of micturition: Secondary | ICD-10-CM | POA: Diagnosis not present

## 2021-03-05 LAB — WET PREP FOR TRICH, YEAST, CLUE

## 2021-04-15 NOTE — Progress Notes (Signed)
65 y.o. [place] Married White or Caucasian Not Hispanic or Latino female here for annual exam. She would like her umbilical hernia check she states that she feels that it is more sensitive.  She is also feeling blood rushing up and down her legs, it occurs when she goes from lying down to standing.   She has had an umbilical hernia for years. She finds that the hernia is tender to palpation. No baseline pain. Normal BM's every day.   No vaginal bleeding. No urinary c/o. She isn't sexually active, has dryness and dyspareunia.     She has issues with IBS.   Patient's last menstrual period was 12/15/2006.          Sexually active: No.  The current method of family planning is post menopausal status.    Exercising: Yes.    walking  Smoker:  no  Health Maintenance: Pap:  02-09-2019 neg HPV HR neg, 01-17-17 neg History of abnormal Pap:  yes   hx of cryo over 30 years ago MMG:  08/22/20 Bi-rads 1 neg  BMD:    2017 t score -1.2 in her spine Colonoscopy: 2019  TDaP:  Declines Gardasil: NA   reports that she has never smoked. She has never used smokeless tobacco. She reports that she does not drink alcohol and does not use drugs.  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 No plans to retire.   Past Medical History:  Diagnosis Date  . Abnormal Pap smear of cervix    over 30 yrs ago  . 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 Dr Sharol Roussel  . Atrophic vaginitis   . Diverticulosis 3/09  . Enteritis, candida 2011  . Gluten intolerance 2012  . H/O bilateral breast implants   . Melanoma ([place]) 2001   back of neck  . PMB (postmenopausal bleeding) 09/2012   On HRT - PUS/ SHGM with fibroids    Past Surgical History:  Procedure Laterality Date  . BREAST ENHANCEMENT SURGERY Bilateral 30 years ago   silicone, replaced with saline 6 years ago  . CRYOTHERAPY     over 24yrs ago for abnormal pap smear  . MELANOMA EXCISION  2001   back of neck  . MYOMECTOMY  about 1987   for  fibroids    Current Outpatient Medications  Medication Sig Dispense Refill  . Cholecalciferol (VITAMIN D PO) Take 1,000 mg by mouth. K2+D3    . COLLAGEN PO Take by mouth.    . Probiotic Product (PROBIOTIC DAILY PO) Take by mouth.    . thyroid (ARMOUR) 30 MG tablet TAKE 1 TABLET BY MOUTH EVERY DAY     No current facility-administered medications for this visit.    Family History  Problem Relation Age of Onset  . Hypertension Mother   . Hyperlipidemia Mother   . Cancer Father     Review of Systems  Genitourinary:       Vaginal driness    Exam:   BP 120/60   Pulse 87   Ht 5' 5.75" (1.67 m)   Wt 115 lb (52.2 kg)   LMP 12/15/2006   SpO2 98%   BMI 18.70 kg/m   Weight change: @WEIGHTCHANGE @ Height:   Height: 5' 5.75" (167 cm)  Ht Readings from Last 3 Encounters:  04/18/21 5' 5.75" (1.67 m)  03/06/20 5' 5.75" (1.67 m)  02/27/20 5' 5.75" (1.67 m)    General appearance: alert, cooperative and appears stated age Head: Normocephalic, without obvious abnormality, atraumatic  Neck: no adenopathy, supple, symmetrical, trachea midline and thyroid normal to inspection and palpation Lungs: clear to auscultation bilaterally Cardiovascular: regular rate and rhythm Breasts: normal appearance, no masses or tenderness, bilateral implants Abdomen: soft, non-tender; non distended,  no masses,  no organomegaly Extremities: extremities normal, atraumatic, no cyanosis or edema Skin: Skin color, texture, turgor normal. No rashes or lesions Lymph nodes: Cervical, supraclavicular, and axillary nodes normal. No abnormal inguinal nodes palpated Neurologic: Grossly normal   Pelvic: External genitalia:  no lesions              Urethra:  normal appearing urethra with no masses, tenderness or lesions              Bartholins and Skenes: normal                 Vagina: atrophic appearing vagina with normal color and discharge, no lesions              Cervix: no lesions               Bimanual Exam:   Uterus:  normal size, contour, position, consistency, mobility, non-tender              Adnexa: no mass, fullness, tenderness               Rectovaginal: Confirms               Anus:  normal sphincter tone, no lesions  Gae Dry chaperoned for the exam.  1. Encounter for gynecological examination without abnormal finding Discussed breast self exam Discussed calcium and vit D intake No pap this year, given her h/o cryosurgery she should continue with pap smear screening.   2. Vaginal atrophy Discussed lubrication Discussed option of vaginal estrogen, she would like to start - Estradiol 10 MCG TABS vaginal tablet; Place one tablet vaginally qhs x 1 week, then change to 2 x a week.  Dispense: 24 tablet; Refill: 4  3. Postural hypotension Discussed staying well hydrated, sitting prior to standing up Information given  4. History of osteopenia Discussed calcium, vit d, exercise - DG Bone Density; Future  5. Hypoestrogenism  - DG Bone Density; Future  6. Irreducible umbilical hernia - Ambulatory referral to General Surgery  In addition to the breast and pelvic exam, over 30 minutes was spent in total patient care in management of the above issues.  Total patient care was 46 minutes

## 2021-04-18 ENCOUNTER — Ambulatory Visit (INDEPENDENT_AMBULATORY_CARE_PROVIDER_SITE_OTHER): Payer: Medicare HMO | Admitting: Obstetrics and Gynecology

## 2021-04-18 ENCOUNTER — Encounter: Payer: Self-pay | Admitting: Obstetrics and Gynecology

## 2021-04-18 ENCOUNTER — Other Ambulatory Visit: Payer: Self-pay

## 2021-04-18 VITALS — BP 120/60 | HR 87 | Ht 65.75 in | Wt 115.0 lb

## 2021-04-18 DIAGNOSIS — K420 Umbilical hernia with obstruction, without gangrene: Secondary | ICD-10-CM | POA: Diagnosis not present

## 2021-04-18 DIAGNOSIS — N952 Postmenopausal atrophic vaginitis: Secondary | ICD-10-CM

## 2021-04-18 DIAGNOSIS — Z8739 Personal history of other diseases of the musculoskeletal system and connective tissue: Secondary | ICD-10-CM

## 2021-04-18 DIAGNOSIS — E2839 Other primary ovarian failure: Secondary | ICD-10-CM

## 2021-04-18 DIAGNOSIS — I951 Orthostatic hypotension: Secondary | ICD-10-CM

## 2021-04-18 DIAGNOSIS — Z01419 Encounter for gynecological examination (general) (routine) without abnormal findings: Secondary | ICD-10-CM

## 2021-04-18 MED ORDER — ESTRADIOL 10 MCG VA TABS: ORAL_TABLET | VAGINAL | 4 refills | Status: DC

## 2021-04-18 NOTE — Patient Instructions (Addendum)
EXERCISE   We recommended that you start or continue a regular exercise program for good health. Physical activity is anything that gets your body moving, some is better than none. The CDC recommends 150 minutes per week of Moderate-Intensity Aerobic Activity and 2 or more days of Muscle Strengthening Activity.  Benefits of exercise are limitless: helps weight loss/weight maintenance, improves mood and energy, helps with depression and anxiety, improves sleep, tones and strengthens muscles, improves balance, improves bone density, protects from chronic conditions such as heart disease, high blood pressure and diabetes and so much more. To learn more visit: https://www.cdc.gov/physicalactivity/index.html  DIET: Good nutrition starts with a healthy diet of fruits, vegetables, whole grains, and lean protein sources. Drink plenty of water for hydration. Minimize empty calories, sodium, sweets. For more information about dietary recommendations visit: https://health.gov/our-work/nutrition-physical-activity/dietary-guidelines and https://www.myplate.gov/  ALCOHOL:  Women should limit their alcohol intake to no more than 7 drinks/beers/glasses of wine (combined, not each!) per week. Moderation of alcohol intake to this level decreases your risk of breast cancer and liver damage.  If you are concerned that you may have a problem, or your friends have told you they are concerned about your drinking, there are many resources to help. A well-known program that is free, effective, and available to all people all over the nation is Alcoholics Anonymous.  Check out this site to learn more: https://www.aa.org/   CALCIUM AND VITAMIN D:  Adequate intake of calcium and Vitamin D are recommended for bone health.  You should be getting between 1000-1200 mg of calcium and 800 units of Vitamin D daily between diet and supplements  PAP SMEARS:  Pap smears, to check for cervical cancer or precancers,  have traditionally been  done yearly, scientific advances have shown that most women can have pap smears less often.  However, every woman still should have a physical exam from her gynecologist every year. It will include a breast check, inspection of the vulva and vagina to check for abnormal growths or skin changes, a visual exam of the cervix, and then an exam to evaluate the size and shape of the uterus and ovaries. We will also provide age appropriate advice regarding health maintenance, like when you should have certain vaccines, screening for sexually transmitted diseases, bone density testing, colonoscopy, mammograms, etc.   MAMMOGRAMS:  All women over 40 years old should have a routine mammogram.   COLON CANCER SCREENING: Now recommend starting at age 45. At this time colonoscopy is not covered for routine screening until 50. There are take home tests that can be done between 45-49.   COLONOSCOPY:  Colonoscopy to screen for colon cancer is recommended for all women at age 50.  We know, you hate the idea of the prep.  We agree, BUT, having colon cancer and not knowing it is worse!!  Colon cancer so often starts as a polyp that can be seen and removed at colonscopy, which can quite literally save your life!  And if your first colonoscopy is normal and you have no family history of colon cancer, most women don't have to have it again for 10 years.  Once every ten years, you can do something that may end up saving your life, right?  We will be happy to help you get it scheduled when you are ready.  Be sure to check your insurance coverage so you understand how much it will cost.  It may be covered as a preventative service at no cost, but you should check   your particular policy.      Breast Self-Awareness Breast self-awareness means being familiar with how your breasts look and feel. It involves checking your breasts regularly and reporting any changes to your health care provider. Practicing breast self-awareness is  important. A change in your breasts can be a sign of a serious medical problem. Being familiar with how your breasts look and feel allows you to find any problems early, when treatment is more likely to be successful. All women should practice breast self-awareness, including women who have had breast implants. How to do a breast self-exam One way to learn what is normal for your breasts and whether your breasts are changing is to do a breast self-exam. To do a breast self-exam: Look for Changes  1. Remove all the clothing above your waist. 2. Stand in front of a mirror in a room with good lighting. 3. Put your hands on your hips. 4. Push your hands firmly downward. 5. Compare your breasts in the mirror. Look for differences between them (asymmetry), such as: ? Differences in shape. ? Differences in size. ? Puckers, dips, and bumps in one breast and not the other. 6. Look at each breast for changes in your skin, such as: ? Redness. ? Scaly areas. 7. Look for changes in your nipples, such as: ? Discharge. ? Bleeding. ? Dimpling. ? Redness. ? A change in position. Feel for Changes Carefully feel your breasts for lumps and changes. It is best to do this while lying on your back on the floor and again while sitting or standing in the shower or tub with soapy water on your skin. Feel each breast in the following way:  Place the arm on the side of the breast you are examining above your head.  Feel your breast with the other hand.  Start in the nipple area and make  inch (2 cm) overlapping circles to feel your breast. Use the pads of your three middle fingers to do this. Apply light pressure, then medium pressure, then firm pressure. The light pressure will allow you to feel the tissue closest to the skin. The medium pressure will allow you to feel the tissue that is a little deeper. The firm pressure will allow you to feel the tissue close to the ribs.  Continue the overlapping circles,  moving downward over the breast until you feel your ribs below your breast.  Move one finger-width toward the center of the body. Continue to use the  inch (2 cm) overlapping circles to feel your breast as you move slowly up toward your collarbone.  Continue the up and down exam using all three pressures until you reach your armpit.  Write Down What You Find  Write down what is normal for each breast and any changes that you find. Keep a written record with breast changes or normal findings for each breast. By writing this information down, you do not need to depend only on memory for size, tenderness, or location. Write down where you are in your menstrual cycle, if you are still menstruating. If you are having trouble noticing differences in your breasts, do not get discouraged. With time you will become more familiar with the variations in your breasts and more comfortable with the exam. How often should I examine my breasts? Examine your breasts every month. If you are breastfeeding, the best time to examine your breasts is after a feeding or after using a breast pump. If you menstruate, the best time to   examine your breasts is 5-7 days after your period is over. During your period, your breasts are lumpier, and it may be more difficult to notice changes. When should I see my health care provider? See your health care provider if you notice:  A change in shape or size of your breasts or nipples.  A change in the skin of your breast or nipples, such as a reddened or scaly area.  Unusual discharge from your nipples.  A lump or thick area that was not there before.  Pain in your breasts.  Anything that concerns you.  Orthostatic Hypotension Blood pressure is a measurement of how strongly, or weakly, your blood is pressing against the walls of your arteries. Orthostatic hypotension is a sudden drop in blood pressure that happens when you quickly change positions, such as when you get up  from sitting or lying down. Arteries are blood vessels that carry blood from your heart throughout your body. When blood pressure is too low, you may not get enough blood to your brain or to the rest of your organs. This can cause weakness, light-headedness, rapid heartbeat, and fainting. This can last for just a few seconds or for up to a few minutes. Orthostatic hypotension is usually not a serious problem. However, if it happens frequently or gets worse, it may be a sign of something more serious. What are the causes? This condition may be caused by:  Sudden changes in posture, such as standing up quickly after you have been sitting or lying down.  Blood loss.  Loss of body fluids (dehydration).  Heart problems.  Hormone (endocrine) problems.  Pregnancy.  Severe infection.  Lack of certain nutrients.  Severe allergic reactions (anaphylaxis).  Certain medicines, such as blood pressure medicine or medicines that make the body lose excess fluids (diuretics). Sometimes, this condition can be caused by not taking medicine as directed, such as taking too much of a certain medicine. What increases the risk? The following factors may make you more likely to develop this condition:  Age. Risk increases as you get older.  Conditions that affect the heart or the central nervous system.  Taking certain medicines, such as blood pressure medicine or diuretics.  Being pregnant. What are the signs or symptoms? Symptoms of this condition may include:  Weakness.  Light-headedness.  Dizziness.  Blurred vision.  Fatigue.  Rapid heartbeat.  Fainting, in severe cases. How is this diagnosed? This condition is diagnosed based on:  Your medical history.  Your symptoms.  Your blood pressure measurement. Your health care provider will check your blood pressure when you are: ? Lying down. ? Sitting. ? Standing. A blood pressure reading is recorded as two numbers, such as "120 over  80" (or 120/80). The first ("top") number is called the systolic pressure. It is a measure of the pressure in your arteries as your heart beats. The second ("bottom") number is called the diastolic pressure. It is a measure of the pressure in your arteries when your heart relaxes between beats. Blood pressure is measured in a unit called mm Hg. Healthy blood pressure for most adults is 120/80. If your blood pressure is below 90/60, you may be diagnosed with hypotension. Other information or tests that may be used to diagnose orthostatic hypotension include:  Your other vital signs, such as your heart rate and temperature.  Blood tests.  Tilt table test. For this test, you will be safely secured to a table that moves you from a lying position  to an upright position. Your heart rhythm and blood pressure will be monitored during the test. How is this treated? This condition may be treated by:  Changing your diet. This may involve eating more salt (sodium) or drinking more water.  Taking medicines to raise your blood pressure.  Changing the dosage of certain medicines you are taking that might be lowering your blood pressure.  Wearing compression stockings. These stockings help to prevent blood clots and reduce swelling in your legs. In some cases, you may need to go to the hospital for:  Fluid replacement. This means you will receive fluids through an IV.  Blood replacement. This means you will receive donated blood through an IV (transfusion).  Treating an infection or heart problems, if this applies.  Monitoring. You may need to be monitored while medicines that you are taking wear off. Follow these instructions at home: Eating and drinking  Drink enough fluid to keep your urine pale yellow.  Eat a healthy diet, and follow instructions from your health care provider about eating or drinking restrictions. A healthy diet includes: ? Fresh fruits and vegetables. ? Whole grains. ? Lean  meats. ? Low-fat dairy products.  Eat extra salt only as directed. Do not add extra salt to your diet unless your health care provider told you to do that.  Eat frequent, small meals.  Avoid standing up suddenly after eating.   Medicines  Take over-the-counter and prescription medicines only as told by your health care provider. ? Follow instructions from your health care provider about changing the dosage of your current medicines, if this applies. ? Do not stop or adjust any of your medicines on your own. General instructions  Wear compression stockings as told by your health care provider.  Get up slowly from lying down or sitting positions. This gives your blood pressure a chance to adjust.  Avoid hot showers and excessive heat as directed by your health care provider.  Return to your normal activities as told by your health care provider. Ask your health care provider what activities are safe for you.  Do not use any products that contain nicotine or tobacco, such as cigarettes, e-cigarettes, and chewing tobacco. If you need help quitting, ask your health care provider.  Keep all follow-up visits as told by your health care provider. This is important.   Contact a health care provider if you:  Vomit.  Have diarrhea.  Have a fever for more than 2-3 days.  Feel more thirsty than usual.  Feel weak and tired. Get help right away if you:  Have chest pain.  Have a fast or irregular heartbeat.  Develop numbness in any part of your body.  Cannot move your arms or your legs.  Have trouble speaking.  Become sweaty or feel light-headed.  Faint.  Feel short of breath.  Have trouble staying awake.  Feel confused. Summary  Orthostatic hypotension is a sudden drop in blood pressure that happens when you quickly change positions.  Orthostatic hypotension is usually not a serious problem.  It is diagnosed by having your blood pressure taken lying down, sitting, and  then standing.  It may be treated by changing your diet or adjusting your medicines. This information is not intended to replace advice given to you by your health care provider. Make sure you discuss any questions you have with your health care provider. Document Revised: 05/27/2018 Document Reviewed: 05/27/2018 Elsevier Patient Education  2021 [place].  Umbilical Hernia, Adult  A  hernia is a bulge of tissue that pushes through an opening between muscles. An umbilical hernia happens in the abdomen, near the belly button (umbilicus). The hernia may contain tissues from the small intestine, large intestine, or fatty tissue covering the intestines (omentum). Umbilical hernias in adults tend to get worse over time, and they require surgical treatment. There are several types of umbilical hernias. You may have:  A hernia located just above or below the umbilicus (indirect hernia). This is the most common type of umbilical hernia in adults.  A hernia that forms through an opening formed by the umbilicus (direct hernia).  A hernia that comes and goes (reducible hernia). A reducible hernia may be visible only when you strain, lift something heavy, or cough. This type of hernia can be pushed back into the abdomen (reduced).  A hernia that traps abdominal tissue inside the hernia (incarcerated hernia). This type of hernia cannot be reduced.  A hernia that cuts off blood flow to the tissues inside the hernia (strangulated hernia). The tissues can start to die if this happens. This type of hernia requires emergency treatment. What are the causes? An umbilical hernia happens when tissue inside the abdomen presses on a weak area of the abdominal muscles. What increases the risk? You may have a greater risk of this condition if you:  Are obese.  Have had several pregnancies.  Have a buildup of fluid inside your abdomen (ascites).  Have had surgery that weakens the abdominal muscles. What are  the signs or symptoms? The main symptom of this condition is a painless bulge at or near the belly button. A reducible hernia may be visible only when you strain, lift something heavy, or cough. Other symptoms may include:  Dull pain.  A feeling of pressure. Symptoms of a strangulated hernia may include:  Pain that gets increasingly worse.  Nausea and vomiting.  Pain when pressing on the hernia.  Skin over the hernia becoming red or purple.  Constipation.  Blood in the stool. How is this diagnosed? This condition may be diagnosed based on:  A physical exam. You may be asked to cough or strain while standing. These actions increase the pressure inside your abdomen and force the hernia through the opening in your muscles. Your health care provider may try to reduce the hernia by pressing on it.  Your symptoms and medical history. How is this treated? Surgery is the only treatment for an umbilical hernia. Surgery for a strangulated hernia is done as soon as possible. If you have a small hernia that is not incarcerated, you may need to lose weight before having surgery. Follow these instructions at home:  Lose weight, if told by your health care provider.  Do not try to push the hernia back in.  Watch your hernia for any changes in color or size. Tell your health care provider if any changes occur.  You may need to avoid activities that increase pressure on your hernia.  Do not lift anything that is heavier than 10 lb (4.5 kg) until your health care provider says that this is safe.  Take over-the-counter and prescription medicines only as told by your health care provider.  Keep all follow-up visits as told by your health care provider. This is important. Contact a health care provider if:  Your hernia gets larger.  Your hernia becomes painful. Get help right away if:  You develop sudden, severe pain near the area of your hernia.  You have pain as  well as nausea or  vomiting.  You have pain and the skin over your hernia changes color.  You develop a fever. This information is not intended to replace advice given to you by your health care provider. Make sure you discuss any questions you have with your health care provider. Document Revised: 01/13/2018 Document Reviewed: 06/01/2017 Elsevier Patient Education  2021 [place].  Atrophic Vaginitis  Atrophic vaginitis is a condition in which the tissues that line the vagina become dry and thin. This condition is most common in women who have stopped having regular menstrual periods (are in menopause). This usually starts when a woman is 2 to 65 years old. That is the time when a woman's estrogen levels begin to decrease. Estrogen is a female hormone. It helps to keep the tissues of the vagina moist. It stimulates the vagina to produce a clear fluid that lubricates the vagina for sex. This fluid also protects the vagina from infection. Lack of estrogen can cause the lining of the vagina to get thinner and dryer. The vagina may also shrink in size. It may become less elastic. Atrophic vaginitis tends to get worse over time as a woman's estrogen level drops. What are the causes? This condition is caused by the normal drop in estrogen that happens around the time of menopause. What increases the risk? Certain conditions or situations may lower a woman's estrogen level, leading to a higher risk for atrophic vaginitis. You are more likely to develop this condition if:  You are taking medicines that block estrogen.  You have had your ovaries removed.  You are being treated for cancer with radiation or medicines (chemotherapy).  You have given birth or are breastfeeding.  You are older than age 84.  You smoke. What are the signs or symptoms? Symptoms of this condition include:  Pain, soreness, a feeling of pressure, or bleeding during sex (dyspareunia).  Vaginal burning, irritation, or  itching.  Pain or bleeding when a speculum is used in a vaginal exam.  Having burning pain while urinating.  Vaginal discharge. In some cases, there are no symptoms. How is this diagnosed? This condition is diagnosed based on your medical history and a physical exam. This will include a pelvic exam that checks the vaginal tissues. Though rare, you may also have other tests, including:  A urine test.  A test that checks the acid balance in your vagina (acid balance test). How is this treated? Treatment for this condition depends on how severe your symptoms are. Treatment may include:  Using an over-the-counter vaginal lubricant before sex.  Using a long-acting vaginal moisturizer.  Using low-dose estrogen for moderate to severe symptoms that do not respond to other treatments. Options include creams, tablets, and inserts (vaginal rings). Before you use a vaginal estrogen, tell your health care provider if you have a history of: ? Breast cancer. ? Endometrial cancer. ? Blood clots. If you are not sexually active and your symptoms are very mild, you may not need treatment. Follow these instructions at home: Medicines  Take over-the-counter and prescription medicines only as told by your health care provider.  Do not use herbal or alternative medicines unless your health care provider says that you can.  Use over-the-counter creams, lubricants, or moisturizers for dryness only as told by your health care provider. General instructions  If your atrophic vaginitis is caused by menopause, discuss all of your menopause symptoms and treatment options with your health care provider.  Do not douche.  Do not use products that can make your vagina dry. These include: ? Scented feminine sprays. ? Scented tampons. ? Scented soaps.  Vaginal sex can help to improve blood flow and elasticity of vaginal tissue. If you choose to have sex and it hurts, try using a water-soluble lubricant or  moisturizer right before having sex. Contact a health care provider if:  Your discharge looks different than normal.  Your vagina has an unusual smell.  You have new symptoms.  Your symptoms do not improve with treatment.  Your symptoms get worse. Summary  Atrophic vaginitis is a condition in which the tissues that line the vagina become dry and thin. It is most common in women who have stopped having regular menstrual periods (are in menopause).  Treatment options include using vaginal lubricants and low-dose vaginal estrogen.  Contact a health care provider if your vagina has an unusual smell, or if your symptoms get worse or do not improve after treatment. This information is not intended to replace advice given to you by your health care provider. Make sure you discuss any questions you have with your health care provider. Document Revised: 05/31/2020 Document Reviewed: 05/31/2020 Elsevier Patient Education  [place].

## 2021-04-19 ENCOUNTER — Telehealth: Payer: Self-pay | Admitting: *Deleted

## 2021-04-19 NOTE — Telephone Encounter (Signed)
-----   Message from Salvadore Dom, MD sent at 04/18/2021  4:00 PM EDT ----- I have placed a referral to Dr Ninfa Linden for an umbilical hernia.

## 2021-04-19 NOTE — Telephone Encounter (Signed)
Staff message sent to [facility] referral coordinator she will call and let me know time and date.

## 2021-04-22 ENCOUNTER — Ambulatory Visit
Admission: RE | Admit: 2021-04-22 | Discharge: 2021-04-22 | Disposition: A | Discharge status: Home / Self Care | Payer: No Typology Code available for payment source | Source: Ambulatory Visit | Attending: Obstetrics and Gynecology | Admitting: Obstetrics and Gynecology

## 2021-04-22 ENCOUNTER — Other Ambulatory Visit: Payer: Self-pay

## 2021-04-22 DIAGNOSIS — M8588 Other specified disorders of bone density and structure, other site: Secondary | ICD-10-CM | POA: Diagnosis not present

## 2021-04-22 DIAGNOSIS — Z8739 Personal history of other diseases of the musculoskeletal system and connective tissue: Secondary | ICD-10-CM

## 2021-04-22 DIAGNOSIS — Z780 Asymptomatic menopausal state: Secondary | ICD-10-CM | POA: Diagnosis not present

## 2021-04-22 DIAGNOSIS — E2839 Other primary ovarian failure: Secondary | ICD-10-CM

## 2021-04-29 NOTE — Telephone Encounter (Signed)
Pt sch 06/11/21 @ 2:20 pm with Ninfa Linden

## 2021-04-30 DIAGNOSIS — H33313 Horseshoe tear of retina without detachment, bilateral: Secondary | ICD-10-CM | POA: Diagnosis not present

## 2021-04-30 DIAGNOSIS — H524 Presbyopia: Secondary | ICD-10-CM | POA: Diagnosis not present

## 2021-04-30 DIAGNOSIS — H25013 Cortical age-related cataract, bilateral: Secondary | ICD-10-CM | POA: Diagnosis not present

## 2021-04-30 DIAGNOSIS — H2513 Age-related nuclear cataract, bilateral: Secondary | ICD-10-CM | POA: Diagnosis not present

## 2021-05-01 ENCOUNTER — Encounter: Payer: Self-pay | Admitting: Obstetrics and Gynecology

## 2021-05-02 ENCOUNTER — Encounter: Payer: Self-pay | Admitting: Obstetrics and Gynecology

## 2021-05-09 DIAGNOSIS — Z0100 Encounter for examination of eyes and vision without abnormal findings: Secondary | ICD-10-CM | POA: Diagnosis not present

## 2021-05-21 ENCOUNTER — Ambulatory Visit: Payer: Medicare HMO | Admitting: Internal Medicine

## 2021-06-19 DIAGNOSIS — H0102B Squamous blepharitis left eye, upper and lower eyelids: Secondary | ICD-10-CM | POA: Diagnosis not present

## 2021-06-19 DIAGNOSIS — H0102A Squamous blepharitis right eye, upper and lower eyelids: Secondary | ICD-10-CM | POA: Diagnosis not present

## 2021-06-19 DIAGNOSIS — H00024 Hordeolum internum left upper eyelid: Secondary | ICD-10-CM | POA: Diagnosis not present

## 2021-06-24 DIAGNOSIS — K429 Umbilical hernia without obstruction or gangrene: Secondary | ICD-10-CM | POA: Diagnosis not present

## 2021-06-24 DIAGNOSIS — Z7189 Other specified counseling: Secondary | ICD-10-CM | POA: Diagnosis not present

## 2021-06-24 DIAGNOSIS — M8580 Other specified disorders of bone density and structure, unspecified site: Secondary | ICD-10-CM | POA: Diagnosis not present

## 2021-06-24 DIAGNOSIS — Z7185 Encounter for immunization safety counseling: Secondary | ICD-10-CM | POA: Diagnosis not present

## 2021-06-24 DIAGNOSIS — Z2821 Immunization not carried out because of patient refusal: Secondary | ICD-10-CM | POA: Diagnosis not present

## 2021-06-24 DIAGNOSIS — I951 Orthostatic hypotension: Secondary | ICD-10-CM | POA: Diagnosis not present

## 2021-06-25 ENCOUNTER — Other Ambulatory Visit: Payer: Self-pay | Admitting: Surgery

## 2021-06-25 DIAGNOSIS — K420 Umbilical hernia with obstruction, without gangrene: Secondary | ICD-10-CM | POA: Diagnosis not present

## 2021-07-24 ENCOUNTER — Encounter (HOSPITAL_BASED_OUTPATIENT_CLINIC_OR_DEPARTMENT_OTHER): Payer: Self-pay | Admitting: Surgery

## 2021-07-24 ENCOUNTER — Other Ambulatory Visit: Payer: Self-pay

## 2021-07-25 MED ORDER — ENSURE PRE-SURGERY PO LIQD: 296.0000 mL | Freq: Once | ORAL | Status: DC

## 2021-07-25 NOTE — Progress Notes (Signed)

## 2021-07-30 NOTE — H&P (Signed)
PROVIDER: Beverlee Nims, MD  MRN: [id number] DOB: Sep 11, 1956 DATE OF ENCOUNTER: 06/25/2021  Subjective   Chief Complaint: No chief complaint on file.   History of Present Illness: Frances Bright is a 65 y.o. female who is seen today as an office consultation at the request of Dr. Talbert Nan for evaluation of an umbilical hernia.   This is a very pleasant patient referred for evaluation umbilical hernia. She has had a hernia for several years. It is becoming more uncomfortable. She has pain but no obstructive symptoms. She has no nausea or vomiting. Bowel movements are normal. She is otherwise without complaints. Pain is described as sharp.  Review of Systems: A complete review of systems was obtained from the patient. I have reviewed this information and discussed as appropriate with the patient. See HPI as well for other ROS.  Review of Systems  All other systems reviewed and are negative.   Medical History: History reviewed. No pertinent past medical history.  There is no problem list on file for this patient.  Past Surgical History:  Procedure Laterality Date   MASTECTOMY    No Known Allergies  No current outpatient medications on file prior to visit.   No current facility-administered medications on file prior to visit.   Family History  Problem Relation Age of Onset   High blood pressure (Hypertension) Mother   Myocardial Infarction (Heart attack) Mother    Social History   Tobacco Use  Smoking Status Never Smoker  Smokeless Tobacco Never Used    Social History   Socioeconomic History   Marital status: Married  Tobacco Use   Smoking status: Never Smoker   Smokeless tobacco: Never Used  Scientific laboratory technician Use: Never used  Substance and Sexual Activity   Alcohol use: Never   Drug use: Never   Objective:   Vitals:  06/25/21 1400  BP: 122/70  Pulse: 104  Temp: 36.9 C (98.4 F)  SpO2: 100%  Weight: 52.2 kg (115 lb)  Height: 166.4 cm  (5' 5.5")   Body mass index is 18.85 kg/m.  Physical Exam Constitutional:  General: She is not in acute distress. Appearance: Normal appearance. She is not ill-appearing.  HENT:  Head: Normocephalic and atraumatic.  Cardiovascular:  Rate and Rhythm: Normal rate and regular rhythm.  Pulmonary:  Effort: Pulmonary effort is normal.  Breath sounds: Normal breath sounds.  Abdominal:  General: Abdomen is flat.  Hernia: A hernia is present.  Comments: There is a small, chronically incarcerated, tender hernia just above the umbilicus  Neurological:  Mental Status: She is alert.     Labs, Imaging and Diagnostic Testing:  Assessment and Plan:  Diagnoses and all orders for this visit:  Umbilical hernia, incarcerated - [facility] Case Posting Request; Future    . I have reviewed her notes in the electronic medical records and reviewed previous CT scans of the abdomen and pelvis. 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 I suspect the fascial defect is quite small. I discussed abdominal anatomy with her. We discussed the reasons to repair hernias. As she is symptomatic I would recommend an umbilical hernia repair. This may or may not require mesh depending on the size of the fascial defect. I explained the surgical procedure in detail. We discussed the risk which includes but is not limited to bleeding, infection, injury to surrounding structures, the use of mesh, cardiopulmonary issues, postoperative recovery, etc. She understands and wishes to proceed with surgery  which will be scheduled    Frances Kleinpeter Georgina Pillion, MD

## 2021-07-31 ENCOUNTER — Encounter (HOSPITAL_BASED_OUTPATIENT_CLINIC_OR_DEPARTMENT_OTHER): Payer: Self-pay | Admitting: Surgery

## 2021-07-31 ENCOUNTER — Ambulatory Visit (HOSPITAL_BASED_OUTPATIENT_CLINIC_OR_DEPARTMENT_OTHER): Payer: Medicare HMO | Admitting: Anesthesiology

## 2021-07-31 ENCOUNTER — Encounter (HOSPITAL_BASED_OUTPATIENT_CLINIC_OR_DEPARTMENT_OTHER)
Admission: RE | Disposition: A | Discharge status: Home / Self Care | Payer: Self-pay | Source: Home / Self Care | Attending: Surgery

## 2021-07-31 ENCOUNTER — Ambulatory Visit (HOSPITAL_BASED_OUTPATIENT_CLINIC_OR_DEPARTMENT_OTHER)
Admission: RE | Admit: 2021-07-31 | Discharge: 2021-07-31 | Disposition: A | Discharge status: Home / Self Care | Payer: Medicare HMO | Attending: Surgery | Admitting: Surgery

## 2021-07-31 ENCOUNTER — Other Ambulatory Visit: Payer: Self-pay

## 2021-07-31 DIAGNOSIS — K420 Umbilical hernia with obstruction, without gangrene: Secondary | ICD-10-CM | POA: Diagnosis not present

## 2021-07-31 DIAGNOSIS — Z9010 Acquired absence of unspecified breast and nipple: Secondary | ICD-10-CM | POA: Insufficient documentation

## 2021-07-31 DIAGNOSIS — E039 Hypothyroidism, unspecified: Secondary | ICD-10-CM | POA: Diagnosis not present

## 2021-07-31 DIAGNOSIS — D72819 Decreased white blood cell count, unspecified: Secondary | ICD-10-CM | POA: Diagnosis not present

## 2021-07-31 DIAGNOSIS — K219 Gastro-esophageal reflux disease without esophagitis: Secondary | ICD-10-CM | POA: Diagnosis not present

## 2021-07-31 HISTORY — PX: UMBILICAL HERNIA REPAIR: SHX196

## 2021-07-31 HISTORY — DX: Hypothyroidism, unspecified: E03.9

## 2021-07-31 LAB — NO BLOOD PRODUCTS

## 2021-07-31 SURGERY — REPAIR, HERNIA, UMBILICAL, ADULT
Anesthesia: General | Site: Abdomen

## 2021-07-31 MED ORDER — MIDAZOLAM HCL 5 MG/5ML IJ SOLN
INTRAMUSCULAR | Status: DC | PRN
  Administered 2021-07-31: 2 mg via INTRAVENOUS

## 2021-07-31 MED ORDER — ACETAMINOPHEN 500 MG PO TABS
ORAL_TABLET | ORAL | Status: AC
  Filled 2021-07-31: qty 2

## 2021-07-31 MED ORDER — OXYCODONE HCL 5 MG PO TABS
5.0000 mg | ORAL_TABLET | Freq: Once | ORAL | Status: AC | PRN
Start: 2021-07-31 — End: 2021-07-31
  Administered 2021-07-31: 5 mg via ORAL

## 2021-07-31 MED ORDER — DEXAMETHASONE SODIUM PHOSPHATE 10 MG/ML IJ SOLN
INTRAMUSCULAR | Status: DC | PRN
  Administered 2021-07-31: 5 mg via INTRAVENOUS

## 2021-07-31 MED ORDER — TRAMADOL HCL 50 MG PO TABS: 50.0000 mg | ORAL_TABLET | Freq: Four times a day (QID) | ORAL | 0 refills | Status: DC | PRN

## 2021-07-31 MED ORDER — LACTATED RINGERS IV SOLN: INTRAVENOUS | Status: DC

## 2021-07-31 MED ORDER — ONDANSETRON HCL 4 MG/2ML IJ SOLN
INTRAMUSCULAR | Status: AC
  Filled 2021-07-31: qty 2

## 2021-07-31 MED ORDER — PHENYLEPHRINE 40 MCG/ML (10ML) SYRINGE FOR IV PUSH (FOR BLOOD PRESSURE SUPPORT)
PREFILLED_SYRINGE | INTRAVENOUS | Status: AC
  Filled 2021-07-31: qty 10

## 2021-07-31 MED ORDER — OXYCODONE HCL 5 MG/5ML PO SOLN: 5.0000 mg | Freq: Once | ORAL | Status: AC | PRN

## 2021-07-31 MED ORDER — CIPROFLOXACIN IN D5W 400 MG/200ML IV SOLN
400.0000 mg | INTRAVENOUS | Status: AC
  Administered 2021-07-31: 400 mg via INTRAVENOUS

## 2021-07-31 MED ORDER — FENTANYL CITRATE (PF) 100 MCG/2ML IJ SOLN
INTRAMUSCULAR | Status: AC
  Filled 2021-07-31: qty 2

## 2021-07-31 MED ORDER — MIDAZOLAM HCL 2 MG/2ML IJ SOLN
INTRAMUSCULAR | Status: AC
  Filled 2021-07-31: qty 2

## 2021-07-31 MED ORDER — CIPROFLOXACIN IN D5W 400 MG/200ML IV SOLN
INTRAVENOUS | Status: AC
  Filled 2021-07-31: qty 200

## 2021-07-31 MED ORDER — OXYCODONE HCL 5 MG PO TABS
ORAL_TABLET | ORAL | Status: AC
  Filled 2021-07-31: qty 1

## 2021-07-31 MED ORDER — BUPIVACAINE-EPINEPHRINE (PF) 0.25% -1:200000 IJ SOLN
INTRAMUSCULAR | Status: DC | PRN
  Administered 2021-07-31: 10 mL

## 2021-07-31 MED ORDER — PROPOFOL 10 MG/ML IV BOLUS
INTRAVENOUS | Status: DC | PRN
  Administered 2021-07-31: 120 mg via INTRAVENOUS

## 2021-07-31 MED ORDER — EPHEDRINE 5 MG/ML INJ
INTRAVENOUS | Status: AC
  Filled 2021-07-31: qty 10

## 2021-07-31 MED ORDER — EPHEDRINE SULFATE 50 MG/ML IJ SOLN
INTRAMUSCULAR | Status: DC | PRN
  Administered 2021-07-31: 10 mg via INTRAVENOUS

## 2021-07-31 MED ORDER — FENTANYL CITRATE (PF) 100 MCG/2ML IJ SOLN
INTRAMUSCULAR | Status: DC | PRN
  Administered 2021-07-31 (×2): 25 ug via INTRAVENOUS

## 2021-07-31 MED ORDER — FENTANYL CITRATE (PF) 100 MCG/2ML IJ SOLN
25.0000 ug | INTRAMUSCULAR | Status: DC | PRN
  Administered 2021-07-31: 25 ug via INTRAVENOUS
  Administered 2021-07-31: 50 ug via INTRAVENOUS

## 2021-07-31 MED ORDER — ONDANSETRON HCL 4 MG/2ML IJ SOLN: 4.0000 mg | Freq: Once | INTRAMUSCULAR | Status: DC | PRN

## 2021-07-31 MED ORDER — CHLORHEXIDINE GLUCONATE CLOTH 2 % EX PADS: 6.0000 | MEDICATED_PAD | Freq: Once | CUTANEOUS | Status: DC

## 2021-07-31 MED ORDER — ONDANSETRON HCL 4 MG/2ML IJ SOLN
INTRAMUSCULAR | Status: DC | PRN
  Administered 2021-07-31: 4 mg via INTRAVENOUS

## 2021-07-31 MED ORDER — DEXAMETHASONE SODIUM PHOSPHATE 10 MG/ML IJ SOLN
INTRAMUSCULAR | Status: AC
  Filled 2021-07-31: qty 1

## 2021-07-31 MED ORDER — LIDOCAINE HCL (CARDIAC) PF 100 MG/5ML IV SOSY
PREFILLED_SYRINGE | INTRAVENOUS | Status: DC | PRN
  Administered 2021-07-31: 60 mg via INTRAVENOUS

## 2021-07-31 MED ORDER — PROPOFOL 10 MG/ML IV BOLUS
INTRAVENOUS | Status: AC
  Filled 2021-07-31: qty 20

## 2021-07-31 MED ORDER — ACETAMINOPHEN 500 MG PO TABS
1000.0000 mg | ORAL_TABLET | ORAL | Status: AC
  Administered 2021-07-31: 1000 mg via ORAL

## 2021-07-31 MED ORDER — PHENYLEPHRINE HCL (PRESSORS) 10 MG/ML IV SOLN
INTRAVENOUS | Status: DC | PRN
  Administered 2021-07-31 (×2): 40 ug via INTRAVENOUS

## 2021-07-31 SURGICAL SUPPLY — 42 items
ADH SKN CLS APL DERMABOND .7 (GAUZE/BANDAGES/DRESSINGS) ×1
APL PRP STRL LF DISP 70% ISPRP (MISCELLANEOUS) ×1
BLADE CLIPPER SURG (BLADE) IMPLANT
BLADE SURG 15 STRL LF DISP TIS (BLADE) ×1 IMPLANT
BLADE SURG 15 STRL SS (BLADE) ×2
CANISTER SUCT 1200ML W/VALVE (MISCELLANEOUS) ×1 IMPLANT
CHLORAPREP W/TINT 26 (MISCELLANEOUS) ×2 IMPLANT
COVER BACK TABLE 60X90IN (DRAPES) ×2 IMPLANT
COVER MAYO STAND STRL (DRAPES) ×2 IMPLANT
DECANTER SPIKE VIAL GLASS SM (MISCELLANEOUS) IMPLANT
DERMABOND ADVANCED (GAUZE/BANDAGES/DRESSINGS) ×1
DERMABOND ADVANCED .7 DNX12 (GAUZE/BANDAGES/DRESSINGS) ×2 IMPLANT
DRAPE LAPAROTOMY 100X72 PEDS (DRAPES) ×2 IMPLANT
DRAPE UTILITY XL STRL (DRAPES) ×2 IMPLANT
DRSG TEGADERM 2-3/8X2-3/4 SM (GAUZE/BANDAGES/DRESSINGS) IMPLANT
ELECT REM PT RETURN 9FT ADLT (ELECTROSURGICAL) ×2
ELECTRODE REM PT RTRN 9FT ADLT (ELECTROSURGICAL) ×1 IMPLANT
GLOVE SURG SIGNA 7.5 PF LTX (GLOVE) ×2 IMPLANT
GOWN STRL REUS W/ TWL LRG LVL3 (GOWN DISPOSABLE) ×1 IMPLANT
GOWN STRL REUS W/ TWL XL LVL3 (GOWN DISPOSABLE) ×1 IMPLANT
GOWN STRL REUS W/TWL LRG LVL3 (GOWN DISPOSABLE) ×2
GOWN STRL REUS W/TWL XL LVL3 (GOWN DISPOSABLE) ×2
NDL HYPO 25X1 1.5 SAFETY (NEEDLE) ×1 IMPLANT
NEEDLE HYPO 25X1 1.5 SAFETY (NEEDLE) ×2 IMPLANT
NS IRRIG 1000ML POUR BTL (IV SOLUTION) ×1 IMPLANT
PACK BASIN DAY SURGERY FS (CUSTOM PROCEDURE TRAY) ×2 IMPLANT
PENCIL SMOKE EVACUATOR (MISCELLANEOUS) ×2 IMPLANT
SLEEVE SCD COMPRESS KNEE MED (STOCKING) ×2 IMPLANT
SPONGE T-LAP 4X18 ~~LOC~~+RFID (SPONGE) ×1 IMPLANT
SUT ETHIBOND NAB CT1 #1 30IN (SUTURE) ×1 IMPLANT
SUT MNCRL AB 4-0 PS2 18 (SUTURE) ×2 IMPLANT
SUT NOVA 0 T19/GS 22DT (SUTURE) IMPLANT
SUT NOVA NAB DX-16 0-1 5-0 T12 (SUTURE) IMPLANT
SUT NOVA NAB GS-21 1 T12 (SUTURE) IMPLANT
SUT VIC AB 2-0 SH 27 (SUTURE)
SUT VIC AB 2-0 SH 27XBRD (SUTURE) IMPLANT
SUT VIC AB 3-0 SH 27 (SUTURE) ×2
SUT VIC AB 3-0 SH 27X BRD (SUTURE) ×1 IMPLANT
SYR CONTROL 10ML LL (SYRINGE) ×2 IMPLANT
TOWEL GREEN STERILE FF (TOWEL DISPOSABLE) ×2 IMPLANT
TUBE CONNECTING 20X1/4 (TUBING) IMPLANT
YANKAUER SUCT BULB TIP NO VENT (SUCTIONS) IMPLANT

## 2021-07-31 NOTE — Anesthesia Postprocedure Evaluation (Signed)
Anesthesia Post Note  Patient: Frances Bright  Procedure(s) Performed: UMBILICAL HERNIA REPAIR (Abdomen)     Patient location during evaluation: PACU Anesthesia Type: General Level of consciousness: awake and alert and oriented Pain management: pain level controlled Vital Signs Assessment: post-procedure vital signs reviewed and stable Respiratory status: spontaneous breathing, nonlabored ventilation and respiratory function stable Cardiovascular status: blood pressure returned to baseline and stable Postop Assessment: no apparent nausea or vomiting Anesthetic complications: no   No notable events documented.  Last Vitals:  Vitals:   07/31/21 1615 07/31/21 1630  BP: 126/64 128/64  Pulse: 91 79  Resp: 14 15  Temp:    SpO2: 100% 95%    Last Pain:  Vitals:   07/31/21 1638  TempSrc:   PainSc: 5                  Ossiel Marchio A.

## 2021-07-31 NOTE — Op Note (Signed)
UMBILICAL HERNIA REPAIR  Procedure Note  Frances Bright 07/31/2021   Pre-op Diagnosis: INCARCERATED UMBILICAL HERNIA     Post-op Diagnosis: same  Procedure(s): UMBILICAL HERNIA REPAIR  Surgeon(s): Coralie Keens, MD  Anesthesia: General  Staff:  Circulator: McDonough-Hughes, Delene Ruffini, RN Scrub Person: Kandis Nab Circulator Assistant: Patric Dykes, RN  Estimated Blood Loss: Minimal               Findings: The patient was found to have a small 5 mm fascial defect at the umbilicus containing incarcerated omentum.  The hernia was repaired primarily  Procedure: The patient is brought to operating identifies correct patient.  She is placed upon the operating room table and general anesthesia was induced.  Her abdomen was prepped and draped in usual sterile fashion.  I anesthetized the skin with a small incision scar just below the umbilicus with Marcaine.  I then made incision through the scar with a #15 blade.  I then took this down to the hernia sac which was easily identified.  It was found to contain incarcerated omentum.  I excised the hernia sac and omentum reducing the rest into the abdominal cavity.  The fascial defect itself was only approximately 5 mm in size.  I elected to close it primarily with a figure-of-eight #1 Ethibond suture.  I anesthetized the fascia further with Marcaine.  I then closed the subcutaneous tissue with interrupted 3-0 Vicryl sutures and closed skin with running 4-0 Monocryl.  Dermabond was then applied.  The patient tolerated the procedure well.  All the counts were correct at the end of the procedure.  The patient was then extubated in the operating room and taken in stable condition to the recovery room.          Coralie Keens   Date: 07/31/2021  Time: 3:59 PM

## 2021-07-31 NOTE — Interval H&P Note (Signed)
History and Physical Interval Note:no change in H and P  07/31/2021 2:21 PM  Frances Bright  has presented today for surgery, with the diagnosis of INCARCERATED UMBILICAL HERNIA.  The various methods of treatment have been discussed with the patient and family. After consideration of risks, benefits and other options for treatment, the patient has consented to  Procedure(s) with comments: [place] (N/A) - LMA as a surgical intervention.  The patient's history has been reviewed, patient examined, no change in status, stable for surgery.  I have reviewed the patient's chart and labs.  Questions were answered to the patient's satisfaction.     Coralie Keens

## 2021-07-31 NOTE — Discharge Instructions (Addendum)
[facility] _______[facility]  UMBILICAL OR INGUINAL HERNIA REPAIR: POST OP INSTRUCTIONS  Always review your discharge instruction sheet given to you by the facility where your surgery was performed. IF YOU HAVE DISABILITY OR FAMILY LEAVE FORMS, YOU MUST BRING THEM TO THE OFFICE FOR PROCESSING.   DO NOT GIVE THEM TO YOUR DOCTOR.  1. A  prescription for pain medication may be given to you upon discharge.  Take your pain medication as prescribed, if needed.  If narcotic pain medicine is not needed, then you may take acetaminophen (Tylenol) or ibuprofen (Advil) as needed. 2. Take your usually prescribed medications unless otherwise directed. If you need a refill on your pain medication, please contact your pharmacy.  They will contact our office to request authorization. Prescriptions will not be filled after 5 pm or on week-ends. 3. You should follow a light diet the first 24 hours after arrival home, such as soup and crackers, etc.  Be sure to include lots of fluids daily.  Resume your normal diet the day after surgery. 4.Most patients will experience some swelling and bruising around the umbilicus or in the groin and scrotum.  Ice packs and reclining will help.  Swelling and bruising can take several days to resolve.  6. It is common to experience some constipation if taking pain medication after surgery.  Increasing fluid intake and taking a stool softener (such as Colace) will usually help or prevent this problem from occurring.  A mild laxative (Milk of Magnesia or Miralax) should be taken according to package directions if there are no bowel movements after 48 hours. 7. Unless discharge instructions indicate otherwise, you may remove your bandages 24-48 hours after surgery, and you may shower at that time.  You may have steri-strips (small skin tapes) in place directly over the incision.  These strips should be left on the skin for 7-10 days.  If your surgeon used skin glue on the  incision, you may shower in 24 hours.  The glue will flake off over the next 2-3 weeks.  Any sutures or staples will be removed at the office during your follow-up visit. 8. ACTIVITIES:  You may resume regular (light) daily activities beginning the next day--such as daily self-care, walking, climbing stairs--gradually increasing activities as tolerated.  You may have sexual intercourse when it is comfortable.  Refrain from any heavy lifting or straining until approved by your doctor.  a.You may drive when you are no longer taking prescription pain medication, you can comfortably wear a seatbelt, and you can safely maneuver your car and apply brakes. b.RETURN TO WORK:   _____________________________________________  9.You should see your doctor in the office for a follow-up appointment approximately 2-3 weeks after your surgery.  Make sure that you call for this appointment within a day or two after you arrive home to insure a convenient appointment time. 10.OTHER INSTRUCTIONS: _OK TO SHOWER STARTING TOMORROW ICE PACK, TYLENOL, AND IBUPROFEN ALSO FOR PAIN. No Tylenol until after 7:45pm today. NO LIFTING MORE THAN 15 POUNDS FOR 4 WEEKS________________________    _____________________________________  WHEN TO CALL YOUR DOCTOR: Fever over 101.0 Inability to urinate Nausea and/or vomiting Extreme swelling or bruising Continued bleeding from incision. Increased pain, redness, or drainage from the incision  The clinic staff is available to answer your questions during regular business hours.  Please don't hesitate to call and ask to speak to one of the nurses for clinical concerns.  If you have a medical emergency, go to the nearest  emergency room or call 911.  A surgeon from [facility] is always on call at the hospital   [address], [place], [place], [place]  [address] ?  [address], [place], [place]   [address] [phone] ? [phone] ? FAX [phone] Web site:  [email or web address]    Post Anesthesia Home Care Instructions  Activity: Get plenty of rest for the remainder of the day. A responsible individual must stay with you for 24 hours following the procedure.  For the next 24 hours, DO NOT: -Drive a car -Paediatric nurse -Drink alcoholic beverages -Take any medication unless instructed by your physician -Make any legal decisions or sign important papers.  Meals: Start with liquid foods such as gelatin or soup. Progress to regular foods as tolerated. Avoid greasy, spicy, heavy foods. If nausea and/or vomiting occur, drink only clear liquids until the nausea and/or vomiting subsides. Call your physician if vomiting continues.  Special Instructions/Symptoms: Your throat may feel dry or sore from the anesthesia or the breathing tube placed in your throat during surgery. If this causes discomfort, gargle with warm salt water. The discomfort should disappear within 24 hours.  If you had a scopolamine patch placed behind your ear for the management of post- operative nausea and/or vomiting:  1. The medication in the patch is effective for 72 hours, after which it should be removed.  Wrap patch in a tissue and discard in the trash. Wash hands thoroughly with soap and water. 2. You may remove the patch earlier than 72 hours if you experience unpleasant side effects which may include dry mouth, dizziness or visual disturbances. 3. Avoid touching the patch. Wash your hands with soap and water after contact with the patch.

## 2021-07-31 NOTE — Transfer of Care (Signed)
Immediate Anesthesia Transfer of Care Note  Patient: Frances Bright  Procedure(s) Performed: UMBILICAL HERNIA REPAIR (Abdomen)  Patient Location: PACU  Anesthesia Type:General  Level of Consciousness: awake, alert  and oriented  Airway & Oxygen Therapy: Patient Spontanous Breathing and Patient connected to face mask oxygen  Post-op Assessment: Report given to RN and Post -op Vital signs reviewed and stable  Post vital signs: Reviewed and stable  Last Vitals:  Vitals Value Taken Time  BP 129/68 07/31/21 1607  Temp    Pulse 90 07/31/21 1614  Resp 15 07/31/21 1614  SpO2 100 % 07/31/21 1614  Vitals shown include unvalidated device data.  Last Pain:  Vitals:   07/31/21 1335  TempSrc: Oral  PainSc: 0-No pain         Complications: No notable events documented.

## 2021-07-31 NOTE — Anesthesia Preprocedure Evaluation (Addendum)
Anesthesia Evaluation  Patient identified by MRN, date of birth, ID band Patient awake    Reviewed: Allergy & Precautions, NPO status , Patient's Chart, lab work & pertinent test results  Airway Mallampati: II  TM Distance: >3 FB Neck ROM: Full    Dental no notable dental hx. (+) Teeth Intact, Caps, Dental Advisory Given   Pulmonary neg pulmonary ROS,    Pulmonary exam normal breath sounds clear to auscultation       Cardiovascular negative cardio ROS Normal cardiovascular exam Rhythm:Regular Rate:Normal     Neuro/Psych negative neurological ROS  negative psych ROS   GI/Hepatic Neg liver ROS, GERD  Medicated and Controlled,Diverticulosis IBS   Endo/Other  Hypothyroidism   Renal/GU negative Renal ROS  negative genitourinary   Musculoskeletal Incarcerated umbilical hernia Hx/o Melanoma 2010   Abdominal   Peds  Hematology  (+) REFUSES BLOOD PRODUCTS, Chronic leukopenia   Anesthesia Other Findings   Reproductive/Obstetrics                            Anesthesia Physical Anesthesia Plan  ASA: 2  Anesthesia Plan: General   Post-op Pain Management:    Induction: Intravenous  PONV Risk Score and Plan: 4 or greater and Treatment may vary due to age or medical condition, Midazolam, Ondansetron and Dexamethasone  Airway Management Planned: Oral ETT  Additional Equipment:   Intra-op Plan:   Post-operative Plan: Extubation in OR  Informed Consent: I have reviewed the patients History and Physical, chart, labs and discussed the procedure including the risks, benefits and alternatives for the proposed anesthesia with the patient or authorized representative who has indicated his/her understanding and acceptance.     Dental advisory given  Plan Discussed with: CRNA and Anesthesiologist  Anesthesia Plan Comments:         Anesthesia Quick Evaluation

## 2021-07-31 NOTE — Anesthesia Procedure Notes (Signed)
Procedure Name: LMA Insertion Date/Time: 07/31/2021 3:33 PM Performed by: Lavonia Dana, CRNA Pre-anesthesia Checklist: Patient identified, Emergency Drugs available, Suction available and Patient being monitored Patient Re-evaluated:Patient Re-evaluated prior to induction Oxygen Delivery Method: Circle system utilized Preoxygenation: Pre-oxygenation with 100% oxygen Induction Type: IV induction Ventilation: Mask ventilation without difficulty LMA: LMA inserted LMA Size: 4.0 Number of attempts: 1 Airway Equipment and Method: Bite block Placement Confirmation: positive ETCO2 Tube secured with: Tape Dental Injury: Teeth and Oropharynx as per pre-operative assessment

## 2021-08-01 ENCOUNTER — Encounter (HOSPITAL_BASED_OUTPATIENT_CLINIC_OR_DEPARTMENT_OTHER): Payer: Self-pay | Admitting: Surgery

## 2021-08-22 ENCOUNTER — Telehealth: Payer: Self-pay

## 2021-08-22 NOTE — Telephone Encounter (Signed)
Pt would like to know if Dr Birdie Riddle will take her on as a new patient her husband is seeing Delfino Lovett as a new patient and she only feels comfortable with a woman provider?

## 2021-08-30 NOTE — Telephone Encounter (Signed)
Made Pt appt in 12/22

## 2021-10-16 DIAGNOSIS — L570 Actinic keratosis: Secondary | ICD-10-CM | POA: Diagnosis not present

## 2021-10-16 DIAGNOSIS — D1801 Hemangioma of skin and subcutaneous tissue: Secondary | ICD-10-CM | POA: Diagnosis not present

## 2021-10-16 DIAGNOSIS — L821 Other seborrheic keratosis: Secondary | ICD-10-CM | POA: Diagnosis not present

## 2021-10-16 DIAGNOSIS — Z85820 Personal history of malignant melanoma of skin: Secondary | ICD-10-CM | POA: Diagnosis not present

## 2021-11-14 ENCOUNTER — Ambulatory Visit: Payer: Medicare HMO | Admitting: Family Medicine

## 2021-11-18 ENCOUNTER — Encounter: Payer: Self-pay | Admitting: Family Medicine

## 2021-11-18 ENCOUNTER — Ambulatory Visit (INDEPENDENT_AMBULATORY_CARE_PROVIDER_SITE_OTHER): Payer: Medicare HMO | Admitting: Family Medicine

## 2021-11-18 VITALS — BP 104/70 | HR 100 | Temp 99.0°F | Resp 16 | Ht 65.5 in | Wt 113.4 lb

## 2021-11-18 DIAGNOSIS — E559 Vitamin D deficiency, unspecified: Secondary | ICD-10-CM | POA: Diagnosis not present

## 2021-11-18 DIAGNOSIS — Z0000 Encounter for general adult medical examination without abnormal findings: Secondary | ICD-10-CM | POA: Insufficient documentation

## 2021-11-18 DIAGNOSIS — E039 Hypothyroidism, unspecified: Secondary | ICD-10-CM

## 2021-11-18 DIAGNOSIS — I83813 Varicose veins of bilateral lower extremities with pain: Secondary | ICD-10-CM | POA: Diagnosis not present

## 2021-11-18 DIAGNOSIS — I951 Orthostatic hypotension: Secondary | ICD-10-CM

## 2021-11-18 LAB — CBC WITH DIFFERENTIAL/PLATELET
Basophils Absolute: 0 10*3/uL (ref 0.0–0.1)
Basophils Relative: 0.5 % (ref 0.0–3.0)
Eosinophils Absolute: 0.1 10*3/uL (ref 0.0–0.7)
Eosinophils Relative: 1.6 % (ref 0.0–5.0)
HCT: 39.4 % (ref 36.0–46.0)
Hemoglobin: 13.3 g/dL (ref 12.0–15.0)
Lymphocytes Relative: 22.8 % (ref 12.0–46.0)
Lymphs Abs: 0.9 10*3/uL (ref 0.7–4.0)
MCHC: 33.9 g/dL (ref 30.0–36.0)
MCV: 86.8 fl (ref 78.0–100.0)
Monocytes Absolute: 0.5 10*3/uL (ref 0.1–1.0)
Monocytes Relative: 12.3 % — ABNORMAL HIGH (ref 3.0–12.0)
Neutro Abs: 2.4 10*3/uL (ref 1.4–7.7)
Neutrophils Relative %: 62.8 % (ref 43.0–77.0)
Platelets: 232 10*3/uL (ref 150.0–400.0)
RBC: 4.54 Mil/uL (ref 3.87–5.11)
RDW: 13.8 % (ref 11.5–15.5)
WBC: 3.8 10*3/uL — ABNORMAL LOW (ref 4.0–10.5)

## 2021-11-18 LAB — HEPATIC FUNCTION PANEL
ALT: 13 U/L (ref 0–35)
AST: 14 U/L (ref 0–37)
Albumin: 4.6 g/dL (ref 3.5–5.2)
Alkaline Phosphatase: 76 U/L (ref 39–117)
Bilirubin, Direct: 0.2 mg/dL (ref 0.0–0.3)
Total Bilirubin: 1.1 mg/dL (ref 0.2–1.2)
Total Protein: 6.9 g/dL (ref 6.0–8.3)

## 2021-11-18 LAB — LIPID PANEL
Cholesterol: 203 mg/dL — ABNORMAL HIGH (ref 0–200)
HDL: 74 mg/dL (ref >39.00)
LDL Cholesterol: 119 mg/dL — ABNORMAL HIGH (ref 0–99)
NonHDL: 128.8
Total CHOL/HDL Ratio: 3
Triglycerides: 47 mg/dL (ref 0.0–149.0)
VLDL: 9.4 mg/dL (ref 0.0–40.0)

## 2021-11-18 LAB — BASIC METABOLIC PANEL
BUN: 14 mg/dL (ref 6–23)
CO2: 31 mEq/L (ref 19–32)
Calcium: 9.5 mg/dL (ref 8.4–10.5)
Chloride: 101 mEq/L (ref 96–112)
Creatinine, Ser: 0.73 mg/dL (ref 0.40–1.20)
GFR: 86.13 mL/min (ref >60.00)
Glucose, Bld: 91 mg/dL (ref 70–99)
Potassium: 4.2 mEq/L (ref 3.5–5.1)
Sodium: 140 mEq/L (ref 135–145)

## 2021-11-18 LAB — VITAMIN D 25 HYDROXY (VIT D DEFICIENCY, FRACTURES): VITD: 51.02 ng/mL (ref 30.00–100.00)

## 2021-11-18 LAB — TSH: TSH: 1.6 u[IU]/mL (ref 0.35–5.50)

## 2021-11-18 NOTE — Progress Notes (Signed)
   Subjective:    Patient ID: DESTINEE TABER, female    DOB: 26-May-1956, 65 y.o.   MRN: [id number]  HPI New to establish.  Previous MD- Dr Dalbert Batman.  UTD on pap, colonoscopy, DEXA.  Due for mammogram.  Not interested in PNA vaccines or flu shot.  Hypothyroid- chronic problem, currently on [place] thyroid 30mg  daily  Vit D deficiency- pt has hx of this.  Has not had recent labs.  Low BP- pt typically runs 90/60s.  Will have associated lightheadedness- particularly when changing positions.  Is attempting to eat salt and increase water intake  Leg throbbing- bilateral.  Describes a 'rushing feeling' in her legs like 'i can feel the blood circulating'.  No hx of clots.  No redness, no swelling.  No TTP.  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   Review of Systems For ROS see HPI   This visit occurred during the SARS-CoV-2 public health emergency.  Safety protocols were in place, including screening questions prior to the visit, additional usage of staff PPE, and extensive cleaning of exam room while observing appropriate contact time as indicated for disinfecting solutions.      Objective:   Physical Exam Vitals reviewed.  Constitutional:      General: She is not in acute distress.    Appearance: Normal appearance. She is well-developed. She is not ill-appearing.  HENT:     Head: Normocephalic and atraumatic.  Eyes:     Conjunctiva/sclera: Conjunctivae normal.     Pupils: Pupils are equal, round, and reactive to light.  Neck:     Thyroid: No thyromegaly.  Cardiovascular:     Rate and Rhythm: Normal rate and regular rhythm.     Pulses: Normal pulses.     Heart sounds: Normal heart sounds. No murmur heard. Pulmonary:     Effort: Pulmonary effort is normal. No respiratory distress.     Breath sounds: Normal breath sounds.  Abdominal:     General: There is no distension.     Palpations: Abdomen is soft.     Tenderness: There is no abdominal tenderness.   Musculoskeletal:     Cervical back: Normal range of motion and neck supple.     Right lower leg: No edema.     Left lower leg: No edema.  Lymphadenopathy:     Cervical: No cervical adenopathy.  Skin:    General: Skin is warm and dry.  Neurological:     Mental Status: She is alert and oriented to person, place, and time.  Psychiatric:        Behavior: Behavior normal.           Assessment & Plan:

## 2021-11-18 NOTE — Patient Instructions (Signed)
Schedule your complete physical in 6 months We'll notify you of your lab results and make any changes if needed Make sure you are drinking LOTS of water Continue to add salt to your diet Given your low blood pressure, please change positions SLOWLY to allow yourself time to adjust We'll call you with your Vascular referral Try compression socks and see if that helps w/ the pulsatile throbbing Call with any questions or concerns Stay Safe!  Stay Healthy! Happy Holidays!

## 2021-11-19 ENCOUNTER — Telehealth: Payer: Self-pay

## 2021-11-19 LAB — D-DIMER, QUANTITATIVE: D-Dimer, Quant: 0.26 mcg/mL FEU (ref <0.50)

## 2021-11-19 NOTE — Telephone Encounter (Signed)
-----   Message from Midge Minium, MD sent at 11/19/2021  7:40 AM EST ----- Ddimer is normal.  No evidence of blood clots

## 2021-11-19 NOTE — Telephone Encounter (Signed)
Patient is aware of results.

## 2021-11-19 NOTE — Telephone Encounter (Signed)
Pt aware of labs  

## 2021-12-11 ENCOUNTER — Other Ambulatory Visit: Payer: Self-pay

## 2021-12-11 DIAGNOSIS — I872 Venous insufficiency (chronic) (peripheral): Secondary | ICD-10-CM

## 2021-12-24 ENCOUNTER — Encounter: Payer: Medicare HMO | Admitting: Vascular Surgery

## 2021-12-24 ENCOUNTER — Encounter (HOSPITAL_COMMUNITY): Payer: Medicare HMO

## 2022-01-14 ENCOUNTER — Ambulatory Visit: Payer: Medicare HMO | Admitting: Vascular Surgery

## 2022-01-14 ENCOUNTER — Ambulatory Visit (HOSPITAL_COMMUNITY)
Admission: RE | Admit: 2022-01-14 | Discharge: 2022-01-14 | Disposition: A | Discharge status: Home / Self Care | Payer: Medicare HMO | Source: Ambulatory Visit | Attending: Vascular Surgery | Admitting: Vascular Surgery

## 2022-01-14 ENCOUNTER — Other Ambulatory Visit: Payer: Self-pay

## 2022-01-14 ENCOUNTER — Encounter: Payer: Self-pay | Admitting: Vascular Surgery

## 2022-01-14 DIAGNOSIS — I872 Venous insufficiency (chronic) (peripheral): Secondary | ICD-10-CM

## 2022-01-14 NOTE — Progress Notes (Signed)
Patient name: Frances Bright MRN: [id number] DOB: October 03, 1956 Sex: female  REASON FOR CONSULT: Evaluate lower extremity varicose veins  HPI: Frances Bright is a 66 y.o. female, that presents for evaluation of her lower extremities with a specific concern for varicose veins per the referral.  She describes having these flushing feelings like a wave or ocean that spreads over her lower extremities that happen intermittently.  This has happened for the last several years but has since gotten much better fast few months.  She is ultimately concerned about the etiology and thought she may have a blood clot or some other issue related to her lower extremities and recently asked her PCP to send a D-dimer.  No history of DVT.  No swelling or aching or burning.  She has lost weight recently and things she may have a possible GI infection.  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 Dr Sharol Roussel   Anxiety    Arthritis    Atrophic vaginitis    Diverticulosis 02/2008   Enteritis, candida 2011   GERD (gastroesophageal reflux disease)    Gluten intolerance 2012   H/O bilateral breast implants    Hypothyroidism    Melanoma ([place]) 2001   back of neck   PMB (postmenopausal bleeding) 09/2012   On HRT - PUS/ SHGM with fibroids   Sleep apnea     Past Surgical History:  Procedure Laterality Date   BREAST ENHANCEMENT SURGERY Bilateral 30 years ago   silicone, replaced with saline 6 years ago   CRYOTHERAPY     over 61yrs ago for abnormal pap smear   MELANOMA EXCISION  2001   back of neck   MYOMECTOMY  about 1987   for fibroids   UMBILICAL HERNIA REPAIR N/A 07/31/2021   Procedure: UMBILICAL HERNIA REPAIR;  Surgeon: Coralie Keens, MD;  Location: [place];  Service: General;  Laterality: N/A;  LMA    Family History  Problem Relation Age of Onset   Hypertension Mother     Hyperlipidemia Mother    Cancer Father     SOCIAL HISTORY: Social History   Socioeconomic History   Marital status: Married    Spouse name: Not on file   Number of children: 0   Years of education: Not on file   Highest education level: Not on file  Occupational History   Not on file  Tobacco Use   Smoking status: Never   Smokeless tobacco: Never  Substance and Sexual Activity   Alcohol use: No   Drug use: No   Sexual activity: Not Currently    Partners: Male    Birth control/protection: Post-menopausal  Other Topics Concern   Not on file  Social History Narrative   Not on file   Social Determinants of Health   Financial Resource Strain: Not on file  Food Insecurity: Not on file  Transportation Needs: Not on file  Physical Activity: Not on file  Stress: Not on file  Social Connections: Not on file  Intimate Partner Violence: Not on file    Allergies  Allergen Reactions   Cephalexin     Current Outpatient Medications  Medication Sig Dispense Refill   thyroid (ARMOUR) 30 MG tablet TAKE 1 TABLET BY MOUTH EVERY DAY     Acetylcysteine (NAC PO) Take 300 mg by mouth. daily (Patient not taking: Reported  on 01/14/2022)     ASTRAGALUS PO Take 160 mg by mouth. daily (Patient not taking: Reported on 01/14/2022)     cholecalciferol (VITAMIN D3) 25 MCG (1000 UNIT) tablet Take 1,000 Units by mouth daily. (Patient not taking: Reported on 01/14/2022)     Quercetin 250 MG TABS Take by mouth. daily (Patient not taking: Reported on 01/14/2022)     UNABLE TO FIND 150 mg. Med Name: Miachel Roux (Patient not taking: Reported on 01/14/2022)     Zinc 30 MG TABS Take by mouth. (Patient not taking: Reported on 01/14/2022)     No current facility-administered medications for this visit.    REVIEW OF SYSTEMS:  [X]  denotes positive finding, [ ]  denotes negative finding Cardiac  Comments:  Chest pain or chest pressure:    Shortness of breath upon exertion:    Short of breath when lying flat:     Irregular heart rhythm:        Vascular    Pain in calf, thigh, or hip brought on by ambulation:    Pain in feet at night that wakes you up from your sleep:     Blood clot in your veins:    Leg swelling:         Pulmonary    Oxygen at home:    Productive cough:     Wheezing:         Neurologic    Sudden weakness in arms or legs:     Sudden numbness in arms or legs:     Sudden onset of difficulty speaking or slurred speech:    Temporary loss of vision in one eye:     Problems with dizziness:         Gastrointestinal    Blood in stool:     Vomited blood:         Genitourinary    Burning when urinating:     Blood in urine:        Psychiatric    Major depression:         Hematologic    Bleeding problems:    Problems with blood clotting too easily:        Skin    Rashes or ulcers:        Constitutional    Fever or chills:      PHYSICAL EXAM: Vitals:   01/14/22 1504  BP: 130/71  Pulse: 86  Resp: 14  Temp: 98.2 F (36.8 C)  TempSrc: Temporal  SpO2: 96%  Weight: 107 lb (48.5 kg)  Height: 5\' 5"  (1.651 m)    GENERAL: The patient is a well-nourished female, in no acute distress. The vital signs are documented above. CARDIAC: There is a regular rate and rhythm.  VASCULAR:  Palpable femoral pulses bilaterally Palpable PT pulses bilaterally Right lower extremity spider veins Left lower extremity varicose veins No skin changes PULMONARY: No respiratory distress. ABDOMEN: Soft and non-tender. MUSCULOSKELETAL: There are no major deformities or cyanosis. NEUROLOGIC: No focal weakness or paresthesias are detected. SKIN: There are no ulcers or rashes noted. PSYCHIATRIC: The patient has a normal affect.  DATA:   Indications: Patient states "whooshing feeling" in her legs.      Performing Technologist: Ivan Croft      Examination Guidelines: A complete evaluation includes B-mode imaging,  spectral  Doppler, color Doppler, and power Doppler as needed of  all accessible  portions  of each vessel. Bilateral testing is considered an integral part of a  complete  examination.  Limited examinations for reoccurring indications may be  performed  as noted. The reflux portion of the exam is performed with the patient in  reverse Trendelenburg.  Significant venous reflux is defined as >500 ms in the superficial venous  system, and >1 second in the deep venous system.      Venous Reflux Times  +--------------+---------+------+-----------+------------+--------+   RIGHT          Reflux No Reflux Reflux Time Diameter cms Comments                              Yes                                       +--------------+---------+------+-----------+------------+--------+   CFV                       yes    >1 second                          +--------------+---------+------+-----------+------------+--------+   FV mid         no                                                   +--------------+---------+------+-----------+------------+--------+   Popliteal      no                                                   +--------------+---------+------+-----------+------------+--------+   GSV at [facility]     no                               0.53                +--------------+---------+------+-----------+------------+--------+   GSV prox thigh            yes     >500 ms       0.43                +--------------+---------+------+-----------+------------+--------+   GSV mid thigh             yes     >500 ms       0.46                +--------------+---------+------+-----------+------------+--------+   GSV dist thigh            yes     >500 ms       0.39                +--------------+---------+------+-----------+------------+--------+   GSV at knee    no                               0.43                +--------------+---------+------+-----------+------------+--------+   GSV prox calf             yes     >  500 ms       0.27                 +--------------+---------+------+-----------+------------+--------+   SSV Pop Fossa  no                               0.17                +--------------+---------+------+-----------+------------+--------+   SSV prox calf  no                               0.20                +--------------+---------+------+-----------+------------+--------+   SSV mid calf   no                               0.17                +--------------+---------+------+-----------+------------+--------+      +--------------+---------+------+-----------+------------+--------+   LEFT           Reflux No Reflux Reflux Time Diameter cms Comments                              Yes                                       +--------------+---------+------+-----------+------------+--------+   CFV                       yes    >1 second                          +--------------+---------+------+-----------+------------+--------+   FV mid         no                                                   +--------------+---------+------+-----------+------------+--------+   Popliteal      no                                                   +--------------+---------+------+-----------+------------+--------+   GSV at [facility]     no                               0.39                +--------------+---------+------+-----------+------------+--------+   GSV prox thigh no                               0.49                +--------------+---------+------+-----------+------------+--------+   GSV mid thigh  no  0.44                +--------------+---------+------+-----------+------------+--------+   GSV dist thigh no                               0.38                +--------------+---------+------+-----------+------------+--------+   GSV at knee    no                               0.41                +--------------+---------+------+-----------+------------+--------+   GSV prox calf             yes     >500 ms        0.41                +--------------+---------+------+-----------+------------+--------+   SSV Pop Fossa  no                               0.16                +--------------+---------+------+-----------+------------+--------+   SSV prox calf  no                               0.27                +--------------+---------+------+-----------+------------+--------+   SSV mid calf   no                               0.21                +--------------+---------+------+-----------+------------+--------+           Summary:  Right:  - No evidence of deep vein thrombosis seen in the right lower extremity,  from the common femoral through the popliteal veins.  - No evidence of superficial venous thrombosis in the right lower  extremity.  - Venous reflux is noted in the right common femoral vein.  - Venous reflux is noted in the right greater saphenous vein in the thigh.  - Venous reflux is noted in the right greater saphenous vein in the calf.     Left:  - No evidence of deep vein thrombosis seen in the left lower extremity,  from the common femoral through the popliteal veins.  - No evidence of superficial venous thrombosis in the left lower  extremity.  - Venous reflux is noted in the left common femoral vein.  - Venous reflux is noted in the left greater saphenous vein in the calf.   Assessment/Plan:  66 year old female presents for evaluation of her bilateral lower extremities and the referral states evaluate varicose veins.  In discussing with the patient she is specifically concerned with a whooshing feeling or wave that has been intermittent down both lower extremities.  I discussed that her venous reflux study today shows evidence of bilateral lower extremity venous insufficiency and she has evidence of varicose veins and spider veins on exam.  That being said, I do not think her venous insufficiency/varicose veins are related to the whooshing feeling she gets in her legs.  She does not  have any classic venous insufficiency symptoms.  She also has easily palpable PT pulses with no signs of arterial insufficiency.  I recommended compression, elevation, exercise for conservative measures for her venous insufficiency.  She can follow-up as needed.   Marty Heck, MD [facility]: [phone]

## 2022-04-03 DIAGNOSIS — D6489 Other specified anemias: Secondary | ICD-10-CM | POA: Diagnosis not present

## 2022-04-03 DIAGNOSIS — R5383 Other fatigue: Secondary | ICD-10-CM | POA: Diagnosis not present

## 2022-04-03 DIAGNOSIS — R947 Abnormal results of other endocrine function studies: Secondary | ICD-10-CM | POA: Diagnosis not present

## 2022-04-03 DIAGNOSIS — E039 Hypothyroidism, unspecified: Secondary | ICD-10-CM | POA: Diagnosis not present

## 2022-04-03 DIAGNOSIS — R946 Abnormal results of thyroid function studies: Secondary | ICD-10-CM | POA: Diagnosis not present

## 2022-04-03 DIAGNOSIS — E7841 Elevated Lipoprotein(a): Secondary | ICD-10-CM | POA: Diagnosis not present

## 2022-04-03 DIAGNOSIS — R799 Abnormal finding of blood chemistry, unspecified: Secondary | ICD-10-CM | POA: Diagnosis not present

## 2022-04-03 DIAGNOSIS — E063 Autoimmune thyroiditis: Secondary | ICD-10-CM | POA: Diagnosis not present

## 2022-04-03 DIAGNOSIS — Z7689 Persons encountering health services in other specified circumstances: Secondary | ICD-10-CM | POA: Diagnosis not present

## 2022-04-03 DIAGNOSIS — E782 Mixed hyperlipidemia: Secondary | ICD-10-CM | POA: Diagnosis not present

## 2022-04-03 DIAGNOSIS — R7301 Impaired fasting glucose: Secondary | ICD-10-CM | POA: Diagnosis not present

## 2022-04-03 DIAGNOSIS — E559 Vitamin D deficiency, unspecified: Secondary | ICD-10-CM | POA: Diagnosis not present

## 2022-04-16 ENCOUNTER — Telehealth: Payer: Self-pay | Admitting: Hematology

## 2022-04-16 DIAGNOSIS — H43811 Vitreous degeneration, right eye: Secondary | ICD-10-CM | POA: Diagnosis not present

## 2022-04-16 DIAGNOSIS — H33303 Unspecified retinal break, bilateral: Secondary | ICD-10-CM | POA: Diagnosis not present

## 2022-04-16 DIAGNOSIS — H0102B Squamous blepharitis left eye, upper and lower eyelids: Secondary | ICD-10-CM | POA: Diagnosis not present

## 2022-04-16 DIAGNOSIS — H2513 Age-related nuclear cataract, bilateral: Secondary | ICD-10-CM | POA: Diagnosis not present

## 2022-04-16 DIAGNOSIS — H5203 Hypermetropia, bilateral: Secondary | ICD-10-CM | POA: Diagnosis not present

## 2022-04-16 DIAGNOSIS — H0102A Squamous blepharitis right eye, upper and lower eyelids: Secondary | ICD-10-CM | POA: Diagnosis not present

## 2022-04-16 NOTE — Telephone Encounter (Signed)
Scheduled appt per 5/2 referral. Pt is aware of appt date and time. Pt is aware to arrive 15 mins prior to appt time and to bring and updated insurance card. Pt is aware of appt location.   ?

## 2022-04-16 NOTE — Telephone Encounter (Signed)
Scheduled appt per 5/3 referral. Called pt, no answer. Left msg for pt to call back to discuss appt.  ?

## 2022-04-18 ENCOUNTER — Other Ambulatory Visit: Payer: Self-pay

## 2022-04-18 ENCOUNTER — Inpatient Hospital Stay: Payer: Medicare HMO

## 2022-04-18 ENCOUNTER — Other Ambulatory Visit: Payer: Self-pay | Admitting: Surgery

## 2022-04-18 ENCOUNTER — Inpatient Hospital Stay: Payer: Medicare HMO | Attending: Hematology | Admitting: Hematology

## 2022-04-18 VITALS — BP 124/66 | HR 77 | Temp 98.1°F | Resp 20 | Wt 108.2 lb

## 2022-04-18 DIAGNOSIS — Z79899 Other long term (current) drug therapy: Secondary | ICD-10-CM | POA: Insufficient documentation

## 2022-04-18 DIAGNOSIS — M1990 Unspecified osteoarthritis, unspecified site: Secondary | ICD-10-CM | POA: Diagnosis not present

## 2022-04-18 DIAGNOSIS — K219 Gastro-esophageal reflux disease without esophagitis: Secondary | ICD-10-CM | POA: Insufficient documentation

## 2022-04-18 DIAGNOSIS — R634 Abnormal weight loss: Secondary | ICD-10-CM | POA: Insufficient documentation

## 2022-04-18 DIAGNOSIS — Z79890 Hormone replacement therapy (postmenopausal): Secondary | ICD-10-CM | POA: Diagnosis not present

## 2022-04-18 DIAGNOSIS — Z8349 Family history of other endocrine, nutritional and metabolic diseases: Secondary | ICD-10-CM | POA: Diagnosis not present

## 2022-04-18 DIAGNOSIS — Z8249 Family history of ischemic heart disease and other diseases of the circulatory system: Secondary | ICD-10-CM | POA: Diagnosis not present

## 2022-04-18 DIAGNOSIS — Z881 Allergy status to other antibiotic agents status: Secondary | ICD-10-CM | POA: Insufficient documentation

## 2022-04-18 DIAGNOSIS — K589 Irritable bowel syndrome without diarrhea: Secondary | ICD-10-CM | POA: Insufficient documentation

## 2022-04-18 DIAGNOSIS — R591 Generalized enlarged lymph nodes: Secondary | ICD-10-CM | POA: Insufficient documentation

## 2022-04-18 DIAGNOSIS — Z888 Allergy status to other drugs, medicaments and biological substances status: Secondary | ICD-10-CM | POA: Insufficient documentation

## 2022-04-18 DIAGNOSIS — E039 Hypothyroidism, unspecified: Secondary | ICD-10-CM | POA: Diagnosis not present

## 2022-04-18 DIAGNOSIS — Z808 Family history of malignant neoplasm of other organs or systems: Secondary | ICD-10-CM | POA: Insufficient documentation

## 2022-04-18 DIAGNOSIS — Z85820 Personal history of malignant melanoma of skin: Secondary | ICD-10-CM | POA: Diagnosis not present

## 2022-04-18 DIAGNOSIS — L989 Disorder of the skin and subcutaneous tissue, unspecified: Secondary | ICD-10-CM

## 2022-04-18 DIAGNOSIS — R221 Localized swelling, mass and lump, neck: Secondary | ICD-10-CM

## 2022-04-18 LAB — CBC WITH DIFFERENTIAL/PLATELET
Abs Immature Granulocytes: 0.01 10*3/uL (ref 0.00–0.07)
Basophils Absolute: 0 10*3/uL (ref 0.0–0.1)
Basophils Relative: 1 %
Eosinophils Absolute: 0 10*3/uL (ref 0.0–0.5)
Eosinophils Relative: 1 %
HCT: 38.8 % (ref 36.0–46.0)
Hemoglobin: 12.8 g/dL (ref 12.0–15.0)
Immature Granulocytes: 0 %
Lymphocytes Relative: 31 %
Lymphs Abs: 1.1 10*3/uL (ref 0.7–4.0)
MCH: 29.1 pg (ref 26.0–34.0)
MCHC: 33 g/dL (ref 30.0–36.0)
MCV: 88.2 fL (ref 80.0–100.0)
Monocytes Absolute: 0.4 10*3/uL (ref 0.1–1.0)
Monocytes Relative: 11 %
Neutro Abs: 2.1 10*3/uL (ref 1.7–7.7)
Neutrophils Relative %: 56 %
Platelets: 216 10*3/uL (ref 150–400)
RBC: 4.4 MIL/uL (ref 3.87–5.11)
RDW: 13.2 % (ref 11.5–15.5)
WBC: 3.7 10*3/uL — ABNORMAL LOW (ref 4.0–10.5)
nRBC: 0 % (ref 0.0–0.2)

## 2022-04-18 LAB — C-REACTIVE PROTEIN: CRP: 0.5 mg/dL (ref <1.0)

## 2022-04-18 LAB — HEPATITIS C ANTIBODY: HCV Ab: NONREACTIVE

## 2022-04-18 LAB — CMP (CANCER CENTER ONLY)
ALT: 22 U/L (ref 0–44)
AST: 22 U/L (ref 15–41)
Albumin: 4.6 g/dL (ref 3.5–5.0)
Alkaline Phosphatase: 75 U/L (ref 38–126)
Anion gap: 6 (ref 5–15)
BUN: 22 mg/dL (ref 8–23)
CO2: 28 mmol/L (ref 22–32)
Calcium: 9.2 mg/dL (ref 8.9–10.3)
Chloride: 104 mmol/L (ref 98–111)
Creatinine: 0.74 mg/dL (ref 0.44–1.00)
GFR, Estimated: >60 mL/min (ref >60)
Glucose, Bld: 93 mg/dL (ref 70–99)
Potassium: 4 mmol/L (ref 3.5–5.1)
Sodium: 138 mmol/L (ref 135–145)
Total Bilirubin: 1.2 mg/dL (ref 0.3–1.2)
Total Protein: 7.1 g/dL (ref 6.5–8.1)

## 2022-04-18 LAB — LACTATE DEHYDROGENASE: LDH: 166 U/L (ref 98–192)

## 2022-04-18 LAB — SEDIMENTATION RATE: Sed Rate: 6 mm/hr (ref 0–22)

## 2022-04-18 MED ORDER — PREDNISONE 20 MG PO TABS: 20.0000 mg | ORAL_TABLET | Freq: Every day | ORAL | 0 refills | Status: DC

## 2022-04-18 NOTE — Progress Notes (Signed)
. ? ? ?HEMATOLOGY/ONCOLOGY CONSULTATION NOTE ? ?Date of Service: 04/18/2022 ? ?Patient Care Team: ?Midge Minium, MD as PCP - General (Family Medicine) ?Monna Fam, MD as Consulting Physician (Ophthalmology) ?[facility] ? ?CHIEF COMPLAINTS/PURPOSE OF CONSULTATION:  ?Right upper neck mass with multiple generalized nodules  ? ?HISTORY OF PRESENTING ILLNESS:  ? ?Frances Bright is a wonderful 66 y.o. female who has been referred to [place] by Dr .Birdie Riddle, Aundra Millet, MD and Dr. Dalbert Batman from functional medicine for evaluation and management of possible lymphoma. ? ?Patient has a history of GERD, anxiety, arthritis, hypothyroidism, irritable bowel syndrome, melanoma of the back resected in 2001 who has been following up with functional medicine Dr. Dalbert Batman. ?Patient notes in the last 1 month or so she has developed right neck swelling as well as multiple nodules over her bilateral arms chest upper abdomen and groin.  These are itchy and some of which are bluish-green and appeared to be centered around possible blood vessels.   ?Patient associates initiation of these considerations since she has been on nutritional supplement called Biocidin from Jan 2023 until April 2023. ?She also notes having received multiple IV supplements from her functional medicine physician's office.  She could not tell me exactly what these involved.  She notes these were for improving her energy and for detox. ? ?Patient notes weight loss 10lbs over 9-10 months unintentional ?Chills + 1-2 weeks ?No fevers ?No tick bites no fleas. ?No farm animals contact. ? Dog at home. ?Off HRT for a few years. ?On thyroid replacement - Armor thyroid. (Prior was on naturethyroid about 3 yrs ago). ? ?No other new medications. ?Patient notes no significant new fatigue or change in appetite. 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 ? ?She was  empirically started on doxycycline by Dr. Dalbert Batman. ? ?She was referred to [place] to rule out possible lymphoma. ? ? ?MEDICAL HISTORY:  ?Past Medical History:  ?Diagnosis Date  ? Abnormal Pap smear of cervix   ? over 30 yrs ago  ? 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 adrenal fatigue. Dr Sharol Roussel  ? Anxiety   ? Arthritis   ? Atrophic vaginitis   ? Diverticulosis 02/2008  ? Enteritis, candida 2011  ? GERD (gastroesophageal reflux disease)   ? Gluten intolerance 2012  ? H/O bilateral breast implants   ? Hypothyroidism   ? Melanoma ([place]) 2001  ? back of neck  ? PMB (postmenopausal bleeding) 09/2012  ? On HRT - PUS/ SHGM with fibroids  ? Sleep apnea   ?Irritable bowel syndrome ?Melanoma back of neck - 2001 -- dermatologist every year -- [facility] [place]. ? ?SURGICAL HISTORY: ?Past Surgical History:  ?Procedure Laterality Date  ? BREAST ENHANCEMENT SURGERY Bilateral 30 years ago  ? silicone, replaced with saline 6 years ago  ? CRYOTHERAPY    ? over 52yr ago for abnormal pap smear  ? MELANOMA EXCISION  2001  ? back of neck  ? MYOMECTOMY  about 1987  ? for fibroids  ? UMBILICAL HERNIA REPAIR N/A 07/31/2021  ? Procedure: UMBILICAL HERNIA REPAIR;  Surgeon: BCoralie Keens MD;  Location[place] [facility]  Service: General;  Laterality: N/A;  LMA  ? ? ?SOCIAL HISTORY: ?Social History  ? ?Socioeconomic History  ? Marital status: Married  ?  Spouse name: Not on file  ? Number of children: 0  ? Years of education: Not  on file  ? Highest education level: Not on file  ?Occupational History  ? Not on file  ?Tobacco Use  ? Smoking status: Never  ? Smokeless tobacco: Never  ?Substance and Sexual Activity  ? Alcohol use: No  ? Drug use: No  ? Sexual activity: Not Currently  ?  Partners: Male  ?  Birth control/protection: Post-menopausal  ?Other Topics Concern  ? Not on file  ?Social History Narrative  ? Not on file  ? ?Social Determinants of Health  ? ?Financial Resource Strain: Not on file  ?Food Insecurity: Not on  file  ?Transportation Needs: Not on file  ?Physical Activity: Not on file  ?Stress: Not on file  ?Social Connections: Not on file  ?Intimate Partner Violence: Not on file  ?No cigarettes ?Marijuana in the past ?ETOH social use ? ?FAMILY HISTORY: ?Family History  ?Problem Relation Age of Onset  ? Hypertension Mother   ? Hyperlipidemia Mother   ? Cancer Father   ?Father-- brain cancer ? ? ?ALLERGIES:  is allergic to cephalexin. ?Milk is diarrhea ?Amitriptyline-excessive somnolence ?Gluten ?Testosterone HRT cream- ? ?MEDICATIONS:  ?Current Outpatient Medications  ?Medication Sig Dispense Refill  ? Acetylcysteine (NAC PO) Take 300 mg by mouth. daily (Patient not taking: Reported on 01/14/2022)    ? ASTRAGALUS PO Take 160 mg by mouth. daily (Patient not taking: Reported on 01/14/2022)    ? cholecalciferol (VITAMIN D3) 25 MCG (1000 UNIT) tablet Take 1,000 Units by mouth daily. (Patient not taking: Reported on 01/14/2022)    ? Quercetin 250 MG TABS Take by mouth. daily (Patient not taking: Reported on 01/14/2022)    ? thyroid (ARMOUR) 30 MG tablet TAKE 1 TABLET BY MOUTH EVERY DAY    ? UNABLE TO FIND 150 mg. Med Name: Miachel Roux (Patient not taking: Reported on 01/14/2022)    ? Zinc 30 MG TABS Take by mouth. (Patient not taking: Reported on 01/14/2022)    ? ?No current facility-administered medications for this visit.  ? ? ?REVIEW OF SYSTEMS:   ? ?10 Point review of Systems was done is negative except as noted above. ? ?PHYSICAL EXAMINATION: ?ECOG PERFORMANCE STATUS: 1 - Symptomatic but completely ambulatory ? ?. ?Vitals:  ? 04/18/22 1220  ?BP: 124/66  ?Pulse: 77  ?Resp: 20  ?Temp: 98.1 ?F (36.7 ?C)  ?SpO2: 100%  ? ?Filed Weights  ? 04/18/22 1220  ?Weight: 108 lb 3.2 oz (49.1 kg)  ? ?.Body mass index is 18.01 kg/m?. ? ?GENERAL:alert, in no acute distress and comfortable ?SKIN: no acute rashes, no significant lesions ?EYES: conjunctiva are pink and non-injected, sclera anicteric ?OROPHARYNX: MMM, no exudates, no oropharyngeal  erythema or ulceration ?NECK: supple, no JVD, right upper neck mass about 2 to 3 cm in size.  Uncertain if this is associated with salivary gland or is a lymph node. ?LYMPH:  no palpable lymphadenopathy in the axillary or inguinal regions however multiple nodules in the skin over the arms over the lower chest and upper abdomen area and upper part of lower extremities.  Several of these lesions appear to be related to small venules or blood vessels. ?LUNGS: clear to auscultation b/l with normal respiratory effort ?HEART: regular rate & rhythm ?ABDOMEN:  normoactive bowel sounds , non tender, not distended. ?Extremity: no pedal edema ?PSYCH: alert & oriented x 3 with fluent speech ?NEURO: no focal motor/sensory deficits ? ?LABORATORY DATA:  ?I have reviewed the data as listed ? ?. ? ?  Latest Ref Rng & Units 04/18/2022  ?  2:35 PM 11/18/2021  ? 11:52 AM 10/06/2013  ?  3:34 PM  ?CBC  ?WBC 4.0 - 10.5 K/uL 3.7   3.8     ?Hemoglobin 12.0 - 15.0 g/dL 12.8   13.3   13.4    ?Hematocrit 36.0 - 46.0 % 38.8   39.4     ?Platelets 150 - 400 K/uL 216   232.0     ? ? ?. ? ?  Latest Ref Rng & Units 04/18/2022  ?  2:35 PM 11/18/2021  ? 11:52 AM 05/23/2009  ?  3:15 PM  ?CMP  ?Glucose 70 - 99 mg/dL 93   91   99    ?BUN 8 - 23 mg/dL '22   14   14    '$ ?Creatinine 0.44 - 1.00 mg/dL 0.74   0.73   0.78    ?Sodium 135 - 145 mmol/L 138   140   139    ?Potassium 3.5 - 5.1 mmol/L 4.0   4.2   4.3    ?Chloride 98 - 111 mmol/L 104   101   104    ?CO2 22 - 32 mmol/L '28   31   31    '$ ?Calcium 8.9 - 10.3 mg/dL 9.2   9.5   9.3    ?Total Protein 6.5 - 8.1 g/dL 7.1   6.9   7.0    ?Total Bilirubin 0.3 - 1.2 mg/dL 1.2   1.1   1.2    ?Alkaline Phos 38 - 126 U/L 75   76   66    ?AST 15 - 41 U/L '22   14   25    '$ ?ALT 0 - 44 U/L '22   13   22    '$ ? ? ? ?RADIOGRAPHIC STUDIES: ?I have personally reviewed the radiological images as listed and agreed with the findings in the report. ?No results found. ? ?ASSESSMENT & PLAN:  ?66 year old female with ? ?#1 right upper neck  somewhat lobulated mass ?#2 multiple skin lesions over upper extremities trunk and upper proximal thigh. ?Some of these lesions appear to be related and are present around blood vessels and could suggest a vasculitic

## 2022-04-19 LAB — C3 AND C4
C3 Complement: 83 mg/dL (ref 82–167)
Complement C4, Body Fluid: 25 mg/dL (ref 12–38)

## 2022-04-21 ENCOUNTER — Telehealth: Payer: Self-pay | Admitting: Hematology

## 2022-04-21 LAB — ANCA TITERS
Atypical P-ANCA titer: <1:20 {titer}
C-ANCA: <1:20 {titer}
P-ANCA: <1:20 {titer}

## 2022-04-21 LAB — ANTINUCLEAR ANTIBODIES, IFA: ANA Ab, IFA: NEGATIVE

## 2022-04-21 NOTE — Telephone Encounter (Signed)
Left message with follow-up appointment per 5/5 los. ?

## 2022-04-22 LAB — MULTIPLE MYELOMA PANEL, SERUM
Albumin SerPl Elph-Mcnc: 4.2 g/dL (ref 2.9–4.4)
Albumin/Glob SerPl: 1.7 (ref 0.7–1.7)
Alpha 1: 0.2 g/dL (ref 0.0–0.4)
Alpha2 Glob SerPl Elph-Mcnc: 0.6 g/dL (ref 0.4–1.0)
B-Globulin SerPl Elph-Mcnc: 0.9 g/dL (ref 0.7–1.3)
Gamma Glob SerPl Elph-Mcnc: 0.9 g/dL (ref 0.4–1.8)
Globulin, Total: 2.6 g/dL (ref 2.2–3.9)
IgA: 227 mg/dL (ref 87–352)
IgG (Immunoglobin G), Serum: 735 mg/dL (ref 586–1602)
IgM (Immunoglobulin M), Srm: 132 mg/dL (ref 26–217)
Total Protein ELP: 6.8 g/dL (ref 6.0–8.5)

## 2022-04-30 ENCOUNTER — Other Ambulatory Visit: Payer: Self-pay

## 2022-04-30 ENCOUNTER — Telehealth: Payer: Self-pay

## 2022-04-30 NOTE — Telephone Encounter (Signed)
Returned patient's message in regards to questions related to prednisone medication and recent [employer] denial for scheduled CT scan on 5/24 for neck mass.  ?Dr. Irene Limbo aware of the situation. ?Patient made aware of the situation and that staff members are working on getting approval for CT scan. She will be notified of any changes to the plan. Patient appreciative of this. ?Patient also informed that she no longer needs to continue with prednisone d/t adverse side effects. Allergy and medication list updated accordingly. At this time, patient may continue with daily antihistamine.  ?MD made aware of additional symptoms patient is experiencing including new skin "knots" and unintentional weight loss. No new orders received at this time. Patient knows to call if she has any additional questions or concerns at this time. All questions answered at conclusion of phone call. ?

## 2022-05-07 ENCOUNTER — Ambulatory Visit (HOSPITAL_COMMUNITY)
Admission: RE | Admit: 2022-05-07 | Discharge: 2022-05-07 | Disposition: A | Discharge status: Home / Self Care | Payer: Medicare HMO | Source: Ambulatory Visit | Attending: Hematology | Admitting: Hematology

## 2022-05-07 ENCOUNTER — Other Ambulatory Visit (HOSPITAL_COMMUNITY): Payer: Medicare HMO

## 2022-05-07 DIAGNOSIS — Z9889 Other specified postprocedural states: Secondary | ICD-10-CM | POA: Insufficient documentation

## 2022-05-07 DIAGNOSIS — Z85820 Personal history of malignant melanoma of skin: Secondary | ICD-10-CM | POA: Diagnosis not present

## 2022-05-07 DIAGNOSIS — Z8719 Personal history of other diseases of the digestive system: Secondary | ICD-10-CM | POA: Diagnosis not present

## 2022-05-07 DIAGNOSIS — K828 Other specified diseases of gallbladder: Secondary | ICD-10-CM | POA: Insufficient documentation

## 2022-05-07 DIAGNOSIS — R222 Localized swelling, mass and lump, trunk: Secondary | ICD-10-CM | POA: Insufficient documentation

## 2022-05-07 DIAGNOSIS — R221 Localized swelling, mass and lump, neck: Secondary | ICD-10-CM | POA: Diagnosis not present

## 2022-05-07 DIAGNOSIS — R911 Solitary pulmonary nodule: Secondary | ICD-10-CM | POA: Insufficient documentation

## 2022-05-07 DIAGNOSIS — L989 Disorder of the skin and subcutaneous tissue, unspecified: Secondary | ICD-10-CM | POA: Diagnosis not present

## 2022-05-07 DIAGNOSIS — I700 Atherosclerosis of aorta: Secondary | ICD-10-CM | POA: Diagnosis not present

## 2022-05-07 DIAGNOSIS — E041 Nontoxic single thyroid nodule: Secondary | ICD-10-CM | POA: Diagnosis not present

## 2022-05-07 MED ORDER — SODIUM CHLORIDE (PF) 0.9 % IJ SOLN
INTRAMUSCULAR | Status: AC
  Filled 2022-05-07: qty 50

## 2022-05-07 MED ORDER — IOHEXOL 300 MG/ML  SOLN
100.0000 mL | Freq: Once | INTRAMUSCULAR | Status: AC | PRN
  Administered 2022-05-07: 100 mL via INTRAVENOUS

## 2022-05-08 NOTE — Progress Notes (Unsigned)
[name], [name], MD  Frances Bright D Approved for US guided biopsy/FNA of right neck mass seen on CT neck image 42, series 1.   This may be a core biopsy or FNA.  It will depend on the rad performing the biopsy.   [name]

## 2022-05-09 ENCOUNTER — Inpatient Hospital Stay (HOSPITAL_BASED_OUTPATIENT_CLINIC_OR_DEPARTMENT_OTHER): Payer: Medicare HMO | Admitting: Hematology

## 2022-05-09 DIAGNOSIS — Z79890 Hormone replacement therapy (postmenopausal): Secondary | ICD-10-CM | POA: Diagnosis not present

## 2022-05-09 DIAGNOSIS — Z888 Allergy status to other drugs, medicaments and biological substances status: Secondary | ICD-10-CM | POA: Diagnosis not present

## 2022-05-09 DIAGNOSIS — R229 Localized swelling, mass and lump, unspecified: Secondary | ICD-10-CM

## 2022-05-09 DIAGNOSIS — R591 Generalized enlarged lymph nodes: Secondary | ICD-10-CM | POA: Diagnosis not present

## 2022-05-09 DIAGNOSIS — K219 Gastro-esophageal reflux disease without esophagitis: Secondary | ICD-10-CM | POA: Diagnosis not present

## 2022-05-09 DIAGNOSIS — K589 Irritable bowel syndrome without diarrhea: Secondary | ICD-10-CM | POA: Diagnosis not present

## 2022-05-09 DIAGNOSIS — R221 Localized swelling, mass and lump, neck: Secondary | ICD-10-CM

## 2022-05-09 DIAGNOSIS — R634 Abnormal weight loss: Secondary | ICD-10-CM | POA: Diagnosis not present

## 2022-05-09 DIAGNOSIS — Z85820 Personal history of malignant melanoma of skin: Secondary | ICD-10-CM | POA: Diagnosis not present

## 2022-05-09 DIAGNOSIS — Z808 Family history of malignant neoplasm of other organs or systems: Secondary | ICD-10-CM | POA: Diagnosis not present

## 2022-05-09 DIAGNOSIS — E039 Hypothyroidism, unspecified: Secondary | ICD-10-CM | POA: Diagnosis not present

## 2022-05-09 DIAGNOSIS — M1990 Unspecified osteoarthritis, unspecified site: Secondary | ICD-10-CM | POA: Diagnosis not present

## 2022-05-09 DIAGNOSIS — Z881 Allergy status to other antibiotic agents status: Secondary | ICD-10-CM | POA: Diagnosis not present

## 2022-05-09 DIAGNOSIS — Z79899 Other long term (current) drug therapy: Secondary | ICD-10-CM | POA: Diagnosis not present

## 2022-05-09 DIAGNOSIS — Z8349 Family history of other endocrine, nutritional and metabolic diseases: Secondary | ICD-10-CM | POA: Diagnosis not present

## 2022-05-09 DIAGNOSIS — Z8249 Family history of ischemic heart disease and other diseases of the circulatory system: Secondary | ICD-10-CM | POA: Diagnosis not present

## 2022-05-14 ENCOUNTER — Telehealth: Payer: Self-pay | Admitting: Hematology

## 2022-05-14 NOTE — Telephone Encounter (Signed)
Left message with follow-up appointment per 5/26 los.

## 2022-05-15 ENCOUNTER — Telehealth: Payer: Self-pay

## 2022-05-15 ENCOUNTER — Other Ambulatory Visit: Payer: Self-pay | Admitting: Hematology

## 2022-05-15 DIAGNOSIS — R229 Localized swelling, mass and lump, unspecified: Secondary | ICD-10-CM

## 2022-05-15 NOTE — Telephone Encounter (Signed)
Spoke to patient multiple times to determine best course of action for getting patient's subcutaneous nodules assessed. Patient had called stating that the earliest appointment she could get with the surgeon was 6/27. Dr. Irene Limbo made aware and patient encouraged to see if she could request a different surgeon and obtain an earlier appointment. In the meantime, Dr. Irene Limbo would order [place] core biopsy. Patient able to get earlier appointment on 6/20 and made aware of possible plans to undergo biopsy depending on whichever appointment she is able to get scheduled the earliest.  Patient made aware of the plan and appreciative of call. She will keep [place] informed if she is able to obtain an earlier appointment. Awaiting prior authorization for biopsy to schedule appointment.

## 2022-05-16 NOTE — Progress Notes (Signed)
Marland Kitchen   HEMATOLOGY/ONCOLOGY PHONE VISIT NOTE  Date of Service:05/09/2022   Patient Care Team: Midge Minium, MD as PCP - General (Family Medicine) Monna Fam, MD as Consulting Physician (Ophthalmology) Lady Gary, [facility]  CHIEF COMPLAINTS/PURPOSE OF CONSULTATION:  Follow-up for further evaluation of her right neck and other subcutaneous nodules rule out metastatic melanoma  HISTORY OF PRESENTING ILLNESS:   Frances Bright is a wonderful 66 y.o. female who has been referred to [place] by Dr .Birdie Riddle, Aundra Millet, MD and Dr. Dalbert Batman from functional medicine for evaluation and management of possible lymphoma.  Patient has a history of GERD, anxiety, arthritis, hypothyroidism, irritable bowel syndrome, melanoma of the back resected in 2001 who has been following up with functional medicine Dr. Dalbert Batman. Patient notes in the last 1 month or so she has developed right neck swelling as well as multiple nodules over her bilateral arms chest upper abdomen and groin.  These are itchy and some of which are bluish-green and appeared to be centered around possible blood vessels.   Patient associates initiation of these considerations since she has been on nutritional supplement called Biocidin from Jan 2023 until April 2023. She also notes having received multiple IV supplements from her functional medicine physician's office.  She could not tell me exactly what these involved.  She notes these were for improving her energy and for detox.  Patient notes weight loss 10lbs over 9-10 months unintentional Chills + 1-2 weeks No fevers No tick bites no fleas. No farm animals contact.  Dog at home. Off HRT for a few years. On thyroid replacement - Armor thyroid. (Prior was on naturethyroid about 3 yrs ago).  No other new medications. Patient notes no significant new fatigue or change in appetite. 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  She was empirically started on doxycycline by Dr. Dalbert Batman.  She was referred to [place] to rule out possible lymphoma.  INTERVAL HISTORY  .I connected with Frances Bright on 05/09/2022  at 12:30 PM EDT by telephone visit and verified that I am speaking with the correct person using two identifiers.   I discussed the limitations, risks, security and privacy concerns of performing an evaluation and management service by telemedicine and the availability of in-person appointments. I also discussed with the patient that there may be a patient responsible charge related to this service. The patient expressed understanding and agreed to proceed.   Other persons participating in the visit and their role in the encounter: None  Patient's location: Home Provider's location: [place]  Chief Complaint: Discussion of labs and CT scans  Patient notes that her subcutaneous nodules over the left chest wall and abdomen as well as groin have increased in number and some have increased in size over the last few weeks.  The right neck nodule seems to be smaller in size. No nodules did not change size with her short course of steroids or antihistamines. No fevers no chills no night sweats. Labs from 04/18/2022 were discussed in detail. CT neck chest abdomen pelvis results were discussed in detail. No other acute new focal symptoms.  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 Dr Sharol Roussel   Anxiety    Arthritis    Atrophic  vaginitis    Diverticulosis 02/2008   Enteritis, candida 2011   GERD (gastroesophageal reflux disease)    Gluten intolerance 2012   H/O bilateral breast implants    Hypothyroidism    Melanoma ([place]) 2001   back of neck   PMB (postmenopausal bleeding) 09/2012   On HRT - PUS/ SHGM with fibroids   Sleep apnea    Irritable bowel syndrome Melanoma back of neck - 2001 -- dermatologist every year -- [facility] Dr [place].  SURGICAL HISTORY: Past Surgical History:  Procedure Laterality Date   BREAST ENHANCEMENT SURGERY Bilateral 30 years ago   silicone, replaced with saline 6 years ago   CRYOTHERAPY     over 24yr ago for abnormal pap smear   MELANOMA EXCISION  2001   back of neck   MYOMECTOMY  about 1987   for fibroids   UMBILICAL HERNIA REPAIR N/A 07/31/2021   Procedure: UMBILICAL HERNIA REPAIR;  Surgeon: BCoralie Keens MD;  [place]  Service: General;  Laterality: N/A;  LMA    SOCIAL HISTORY: Social History   Socioeconomic History   Marital status: Married    Spouse name: Not on file   Number of children: 0   Years of education: Not on file   Highest education level: Not on file  Occupational History   Not on file  Tobacco Use   Smoking status: Never   Smokeless tobacco: Never  Substance and Sexual Activity   Alcohol use: No   Drug use: No   Sexual activity: Not Currently    Partners: Male    Birth control/protection: Post-menopausal  Other Topics Concern   Not on file  Social History Narrative   Not on file   Social Determinants of Health   Financial Resource Strain: Not on file  Food Insecurity: Not on file  Transportation Needs: Not on file  Physical Activity: Not on file  Stress: Not on file  Social Connections: Not on file  Intimate Partner Violence: Not on file  No cigarettes Marijuana in the past ETOH social use  FAMILY HISTORY: Family History  Problem Relation Age of Onset   Hypertension Mother    Hyperlipidemia Mother    Cancer Father   Father-- brain cancer   ALLERGIES:  is allergic to prednisone and cephalexin. Milk is diarrhea Amitriptyline-excessive somnolence Gluten Testosterone HRT cream-  MEDICATIONS:  Current Outpatient Medications  Medication Sig Dispense Refill   Acetylcysteine (NAC PO) Take  300 mg by mouth. daily     ASTRAGALUS PO Take 160 mg by mouth. daily     cholecalciferol (VITAMIN D3) 25 MCG (1000 UNIT) tablet Take 1,000 Units by mouth daily.     doxycycline (VIBRAMYCIN) 100 MG capsule      Quercetin 250 MG TABS Take by mouth. daily     thyroid (ARMOUR) 30 MG tablet TAKE 1 TABLET BY MOUTH EVERY DAY     UNABLE TO FIND 150 mg. Med Name: [place]     Zinc 30 MG TABS Take by mouth.     No current facility-administered medications for this visit.    REVIEW OF SYSTEMS:    10 Point review of Systems was done is negative except as noted above.  PHYSICAL EXAMINATION: Telemedicine visit  LABORATORY DATA:  I have reviewed the data as listed  .    Latest Ref Rng & Units 04/18/2022    2:35 PM 11/18/2021   11:52 AM 10/06/2013    3:34 PM  CBC  WBC 4.0 -  10.5 K/uL 3.7   3.8     Hemoglobin 12.0 - 15.0 g/dL 12.8   13.3   13.4    Hematocrit 36.0 - 46.0 % 38.8   39.4     Platelets 150 - 400 K/uL 216   232.0       .    Latest Ref Rng & Units 04/18/2022    2:35 PM 11/18/2021   11:52 AM 05/23/2009    3:15 PM  CMP  Glucose 70 - 99 mg/dL 93   91   99    BUN 8 - 23 mg/dL '22   14   14    '$ Creatinine 0.44 - 1.00 mg/dL 0.74   0.73   0.78    Sodium 135 - 145 mmol/L 138   140   139    Potassium 3.5 - 5.1 mmol/L 4.0   4.2   4.3    Chloride 98 - 111 mmol/L 104   101   104    CO2 22 - 32 mmol/L '28   31   31    '$ Calcium 8.9 - 10.3 mg/dL 9.2   9.5   9.3    Total Protein 6.5 - 8.1 g/dL 7.1   6.9   7.0    Total Bilirubin 0.3 - 1.2 mg/dL 1.2   1.1   1.2    Alkaline Phos 38 - 126 U/L 75   76   66    AST 15 - 41 U/L '22   14   25    '$ ALT 0 - 44 U/L '22   13   22     '$ . Lab Results  Component Value Date   LDH 166 04/18/2022     RADIOGRAPHIC STUDIES: I have personally reviewed the radiological images as listed and agreed with the findings in the report. CT Soft Tissue Neck W Contrast  Result Date: 05/08/2022 CLINICAL DATA:  Right neck mask and sin lesions. EXAM: CT NECK WITH CONTRAST  TECHNIQUE: Multidetector CT imaging of the neck was performed using the standard protocol following the bolus administration of intravenous contrast. RADIATION DOSE REDUCTION: This exam was performed according to the departmental dose-optimization program which includes automated exposure control, adjustment of the mA and/or kV according to patient size and/or use of iterative reconstruction technique. CONTRAST:  171m OMNIPAQUE IOHEXOL 300 MG/ML  SOLN COMPARISON:  None Available. FINDINGS: Pharynx and larynx: No evidence of mass or inflammation. Salivary glands: No inflammation, mass, or stone. Thyroid: Subcentimeter nodules measuring up to 6 mm on the right. No followup recommended (ref: J Am Coll Radiol. 2015 Feb;12(2): 143-50). Lymph nodes: Palpable complaint correlates with a right upper subcutaneous nodule which measures up to 13 mm and shows heterogeneity from low-density components. No additional adenopathy seen throughout the neck. Vascular: Negative Limited intracranial: Negative Visualized orbits: Negative Mastoids and visualized paranasal sinuses: Clear Skeleton: Ordinary cervical spine degeneration Upper chest: Clear apical lungs IMPRESSION: Single subcutaneous nodule in the upper right neck, likely solitary enlarged lymph node. The node is heterogeneous which could be from partial liquefaction/necrosis. No primary mass or infection seen, consider biopsy. Electronically Signed   By: JJorje GuildM.D.   On: 05/08/2022 06:25   CT CHEST ABDOMEN PELVIS W CONTRAST  Result Date: 05/09/2022 CLINICAL DATA:  History of melanoma. Multiple skin nodules under left breast, upper abdomen and arms. * Tracking Code: BO * EXAM: CT CHEST, ABDOMEN, AND PELVIS WITH CONTRAST TECHNIQUE: Multidetector CT imaging of the chest, abdomen and pelvis was performed  following the standard protocol during bolus administration of intravenous contrast. RADIATION DOSE REDUCTION: This exam was performed according to the  departmental dose-optimization program which includes automated exposure control, adjustment of the mA and/or kV according to patient size and/or use of iterative reconstruction technique. CONTRAST:  133m OMNIPAQUE IOHEXOL 300 MG/ML  SOLN COMPARISON:  CT July 25, 2020 FINDINGS: CT CHEST FINDINGS Cardiovascular: Aortic atherosclerosis without aneurysmal dilation. No central pulmonary embolus on this nondedicated study. Normal size heart. No significant pericardial effusion/thickening. Mediastinum/Nodes: No suspicious thyroid nodule. No pathologically enlarged mediastinal, hilar or axillary lymph nodes. The esophagus is grossly unremarkable. Lungs/Pleura: Solid irregular 6 mm left upper lobe pulmonary nodule on image 28/5. No pleural effusion. No pneumothorax. Musculoskeletal: Bilateral breast prostheses. Nodule in the left breast subjacent to the breast prosthesis measures 13 x 8 mm on image 52/3. Additional subcutaneous nodules further described below. CT ABDOMEN PELVIS FINDINGS Hepatobiliary: No suspicious hepatic lesion. Enhancing nodule in the gallbladder measuring 5 mm on image 40/6. No evidence of acute cholecystitis. No biliary ductal dilation. Pancreas: No pancreatic ductal dilation or evidence of acute inflammation. Spleen: No splenomegaly or focal splenic lesion. Adrenals/Urinary Tract: Bilateral adrenal glands appear normal. No hydronephrosis. Urinary bladder is unremarkable for degree of distension. Stomach/Bowel: Radiopaque enteric contrast material traverses the sigmoid colon. Stomach is unremarkable for degree of distension. No pathologic dilation of large or small bowel. No evidence of acute bowel inflammation. Moderate volume of formed stool throughout the colon. Vascular/Lymphatic: Normal caliber abdominal aorta. No pathologically enlarged abdominal or pelvic lymph nodes. Reproductive: Uterus and bilateral adnexa are unremarkable. Other: Numerous scattered small soft tissue nodules in the  abdominopelvic subcutaneous fat. For reference: -Right anterior subcutaneous nodule measures 9 mm on image 64/3 and another measuring 5 mm on image 81/3. -Subcutaneous nodule posterior to the right gluteal muscle measures 10 mm on image 128/3 -Nodule to the left gluteal musculature measuring 8 mm on image 125/3. Musculoskeletal: No aggressive lytic or blastic lesion of bone. IMPRESSION: 1. Numerous scattered small soft tissue nodules in the thoracic and abdominopelvic subcutaneous tissues, suspicious for metastatic disease. Suggest further evaluation with direct tissue sampling. 2. Solid irregular 6 mm left upper lobe pulmonary nodule, is nonspecific but concerning for metastatic disease. 3. Enhancing nodule in the gallbladder measuring 5 mm, nonspecific possibly reflecting a gallbladder polyp but also somewhat suspicious for metastasis. 4. Moderate volume of formed stool throughout the colon. 5.  Aortic Atherosclerosis (ICD10-I70.0). These results will be called to the ordering clinician or representative by the Radiologist Assistant, and communication documented in the PACS or [employer] Electronically Signed   By: JDahlia BailiffM.D.   On: 05/09/2022 13:19    ASSESSMENT & PLAN:  66year old female with  #1 right upper neck somewhat lobulated mass #2 multiple skin lesions over upper extremities trunk and upper proximal thigh. Some of these lesions appear to be related and are present around blood vessels and could suggest a vasculitic process. #3 remote history of melanoma in 2001 resected from the back #4 on multiple supplements follows with functional medicine Plan -Patient's labs from 04/18/2022 were discussed in detail with her. -CBC within normal limits.  Minimal leukopenia at 3.7k CMP within normal limits LDH within normal limits at 166 Sed rate and CRP normal ANCA titers negative ANA negative Hepatitis C antibody negative Myeloma panel no M spike.  CT neck with single subcutaneous  nodule in the right upper neck CT chest abdomen pelvis showed 1. Numerous scattered small soft tissue nodules in the  thoracic and abdominopelvic subcutaneous tissues, suspicious for metastatic disease. Suggest further evaluation with direct tissue sampling. 2. Solid irregular 6 mm left upper lobe pulmonary nodule, is nonspecific but concerning for metastatic disease. 3. Enhancing nodule in the gallbladder measuring 5 mm, nonspecific possibly reflecting a gallbladder polyp but also somewhat suspicious for metastasis  Patient has been referred to Dr. Ninfa Linden for resection/excisional biopsy of a few of her subcutaneous nodules over the chest wall and abdomen for tissue diagnosis.  With her previous history of melanoma would want to rule out metastatic melanoma.  Multiple other possibilities.  Patient called back saying she did not have an appointment with Dr. Ninfa Linden till 7/27 and is anxious about obtaining a diagnosis sooner.  We ordered an ultrasound for core needle biopsies of a few of her subcutaneous nodules.  Orders Placed This Encounter  Procedures   Ambulatory referral to General Surgery    Referral Priority:   Urgent    Referral Type:   Surgical    Referral Reason:   Specialty Services Required    Referred to Provider:   Coralie Keens, MD    Requested Specialty:   General Surgery    Number of Visits Requested:   1   Follow-up Referral to [facility] surgery Dr Ninfa Linden for excision biopsy of subcutaneous nodules next to left breast on chest wall and abdominal wall in patient with remote h/o melanoma and no other overt primary site of tumor -RTC with Dr Irene Limbo in 2 weeks  The total time spent in the appointment was 20 minutes*.  All of the patient's questions were answered with apparent satisfaction. The patient knows to call the clinic with any problems, questions or concerns.   Sullivan Lone MD MS AAHIVMS [facility] Hematology/Oncology Physician [facility]  .*Total Encounter Time as defined by the Centers for Medicare and Medicaid Services includes, in addition to the face-to-face time of a patient visit (documented in the note above) non-face-to-face time: obtaining and reviewing outside history, ordering and reviewing medications, tests or procedures, care coordination (communications with other health care professionals or caregivers) and documentation in the medical record.

## 2022-05-16 NOTE — Progress Notes (Unsigned)
Frances Cleveland, MD  [name] Ok   [place] core SQ nodule R lower chest  See CT Im64 Se3   DDH

## 2022-05-19 ENCOUNTER — Ambulatory Visit (INDEPENDENT_AMBULATORY_CARE_PROVIDER_SITE_OTHER): Payer: Medicare HMO | Admitting: Family Medicine

## 2022-05-19 ENCOUNTER — Encounter: Payer: Self-pay | Admitting: Family Medicine

## 2022-05-19 VITALS — BP 110/64 | HR 87 | Temp 97.7°F | Resp 16 | Ht 66.0 in | Wt 105.5 lb

## 2022-05-19 DIAGNOSIS — C439 Malignant melanoma of skin, unspecified: Secondary | ICD-10-CM

## 2022-05-19 DIAGNOSIS — E559 Vitamin D deficiency, unspecified: Secondary | ICD-10-CM | POA: Diagnosis not present

## 2022-05-19 DIAGNOSIS — Z0000 Encounter for general adult medical examination without abnormal findings: Secondary | ICD-10-CM | POA: Diagnosis not present

## 2022-05-19 NOTE — Assessment & Plan Note (Signed)
Pt is currently undergoing a workup for possible metastatic disease.  Has bx scheduled for Monday 6/12.

## 2022-05-19 NOTE — Assessment & Plan Note (Signed)
Pt's PE WNL w/ exception of low BMI and multiple developing subcutaneous nodules (being worked up by Oncology).  UTD on Tdap, colonoscopy, PNA, and DEXA.  Due for mammo.  Pt to schedule at her convenience.  Reviewed recent labs.  No need to repeat.  Anticipatory guidance provided.

## 2022-05-19 NOTE — Assessment & Plan Note (Signed)
Recent labs from [facility] show Vit D level 47.7

## 2022-05-19 NOTE — Patient Instructions (Addendum)
Follow up in 6 months to recheck weight- sooner if needed No need for labs today based on all that has been done recently Please schedule your mammogram at your convenience (I know you have a lot to do first) Make sure you are eating and drinking regularly Call with any questions or concerns Hang in there!!!

## 2022-05-19 NOTE — Progress Notes (Signed)
   Subjective:    Patient ID: Frances Bright, female    DOB: [birth year], 66 y.o.   MRN: [id number]  HPI CPE- UTD on Tdap, colonoscopy, PNA vaccines, DEXA.  Due for mammo  Patient Care Team    Relationship Specialty Notifications Start End  Midge Minium, MD PCP - General Family Medicine  11/18/21   Monna Fam, MD Consulting Physician Ophthalmology  11/18/21   Lady Gary, [facility]    11/18/21     Health Maintenance  Topic Date Due   MAMMOGRAM  08/22/2021   INFLUENZA VACCINE  07/15/2022   TETANUS/TDAP  11/12/2026   COLONOSCOPY (Pts 45-60yr Insurance coverage will need to be confirmed)  07/12/2028   Pneumonia Vaccine 66 Years old  Completed   DEXA SCAN  Completed   COVID-19 Vaccine  Completed   Hepatitis C Screening  Completed   HPV VACCINES  Aged Out   Zoster Vaccines- Shingrix  Discontinued    Pt has multiple nodules that are appearing all over her body.  Currently working w/ Oncology and has bx scheduled for Monday 6/12.  There is some concern for metastatic melanoma as she has also lost 12 lbs since October.  Review of Systems Patient reports no vision/ hearing changes, fever, persistant/recurrent hoarseness , swallowing issues, chest pain, palpitations, edema, persistant/recurrent cough, hemoptysis, dyspnea (rest/exertional/paroxysmal nocturnal), gastrointestinal bleeding (melena, rectal bleeding), abdominal pain, significant heartburn, bowel changes, GU symptoms (dysuria, hematuria, incontinence), Gyn symptoms (abnormal  bleeding, pain),  syncope, focal weakness, memory loss, numbness & tingling, hair/nail changes, abnormal bruising or bleeding, anxiety, or depression.   + night sweats     Objective:   Physical Exam General Appearance:    Alert, cooperative, no distress, appears stated age  Head:    Normocephalic, without obvious abnormality, atraumatic  Eyes:    PERRL, conjunctiva/corneas clear, EOM's intact both eyes  Ears:    Normal TM's and  external ear canals, both ears  Nose:   Nares normal, septum midline, mucosa normal, no drainage    or sinus tenderness  Throat:   Lips, mucosa, and tongue normal; teeth and gums normal  Neck:   Supple, symmetrical, trachea midline, R sided nodule under mandible   Thyroid: no enlargement/tenderness/nodules  Back:     Symmetric, no curvature, ROM normal, no CVA tenderness  Lungs:     Clear to auscultation bilaterally, respirations unlabored  Chest Wall:    No tenderness or deformity   Heart:    Regular rate and rhythm, S1 and S2 normal, no murmur, rub   or gallop  Breast Exam:    Deferred to mammo  Abdomen:     Soft, non-tender, bowel sounds active all four quadrants,    no masses, no organomegaly  Genitalia:    Deferred  Rectal:    Extremities:   Extremities normal, atraumatic, no cyanosis or edema  Pulses:   2+ and symmetric all extremities  Skin:   Skin color, texture, turgor normal, no rashes or lesions  Lymph nodes:   Cervical, supraclavicular, and axillary nodes normal  Neurologic:   CNII-XII intact, normal strength, sensation and reflexes    throughout          Assessment & Plan:

## 2022-05-20 ENCOUNTER — Other Ambulatory Visit: Payer: Self-pay | Admitting: Surgery

## 2022-05-21 ENCOUNTER — Telehealth: Payer: Self-pay | Admitting: Hematology

## 2022-05-21 NOTE — Telephone Encounter (Signed)
.  Called patient to schedule appointment per 6/7 inbasket, patient is aware of date and time.   

## 2022-05-22 ENCOUNTER — Telehealth: Payer: Self-pay | Admitting: Family Medicine

## 2022-05-22 DIAGNOSIS — Z0183 Encounter for blood typing: Secondary | ICD-10-CM

## 2022-05-22 NOTE — Telephone Encounter (Signed)
Typically blood typing is not covered by insurance and would be paid out of pocket.  A free (and benevolent) way to determine blood type is to donate blood.  They mail you a donor card and it has your blood type on it.  But we can schedule a lab visit here as long as she's ok paying for it

## 2022-05-22 NOTE — Telephone Encounter (Signed)
Patient called wanting to know if she could come in and have a blood test to see what blood type she is. I let her know that would have to be ordered she said she was ok with that and to ask Dr Birdie Riddle if she could order that.

## 2022-05-23 ENCOUNTER — Inpatient Hospital Stay: Payer: Medicare HMO | Admitting: Hematology

## 2022-05-23 ENCOUNTER — Other Ambulatory Visit: Payer: Self-pay | Admitting: Student

## 2022-05-23 ENCOUNTER — Other Ambulatory Visit: Payer: Self-pay

## 2022-05-23 ENCOUNTER — Other Ambulatory Visit: Payer: Medicare HMO

## 2022-05-23 DIAGNOSIS — Z0183 Encounter for blood typing: Secondary | ICD-10-CM

## 2022-05-23 NOTE — Progress Notes (Signed)
Pt here for blood type test collection, pt has been notified by multiple sources that this test will not be covered by insurance and they will receive a bill. Pt states understanding and has requested to proceed.

## 2022-05-23 NOTE — Telephone Encounter (Signed)
Order is entered.  Pt just needs to schedule lab appt

## 2022-05-23 NOTE — Telephone Encounter (Signed)
Left pt a VM stating that we placed the order for the blood draw for blood typing she will just need to call and make a Lab only visit apt .

## 2022-05-26 ENCOUNTER — Ambulatory Visit (HOSPITAL_COMMUNITY)
Admission: RE | Admit: 2022-05-26 | Discharge: 2022-05-26 | Disposition: A | Discharge status: Home / Self Care | Payer: Medicare HMO | Source: Ambulatory Visit | Attending: Hematology | Admitting: Hematology

## 2022-05-26 ENCOUNTER — Encounter (HOSPITAL_COMMUNITY): Payer: Self-pay

## 2022-05-26 ENCOUNTER — Other Ambulatory Visit: Payer: Self-pay

## 2022-05-26 ENCOUNTER — Telehealth: Payer: Self-pay | Admitting: Hematology

## 2022-05-26 ENCOUNTER — Other Ambulatory Visit: Payer: Self-pay | Admitting: Hematology

## 2022-05-26 DIAGNOSIS — C792 Secondary malignant neoplasm of skin: Secondary | ICD-10-CM | POA: Diagnosis not present

## 2022-05-26 DIAGNOSIS — Z85820 Personal history of malignant melanoma of skin: Secondary | ICD-10-CM | POA: Insufficient documentation

## 2022-05-26 DIAGNOSIS — C7989 Secondary malignant neoplasm of other specified sites: Secondary | ICD-10-CM | POA: Diagnosis not present

## 2022-05-26 DIAGNOSIS — R229 Localized swelling, mass and lump, unspecified: Secondary | ICD-10-CM

## 2022-05-26 DIAGNOSIS — G4733 Obstructive sleep apnea (adult) (pediatric): Secondary | ICD-10-CM | POA: Diagnosis not present

## 2022-05-26 DIAGNOSIS — C434 Malignant melanoma of scalp and neck: Secondary | ICD-10-CM | POA: Diagnosis not present

## 2022-05-26 DIAGNOSIS — E039 Hypothyroidism, unspecified: Secondary | ICD-10-CM | POA: Insufficient documentation

## 2022-05-26 DIAGNOSIS — R2242 Localized swelling, mass and lump, left lower limb: Secondary | ICD-10-CM | POA: Diagnosis not present

## 2022-05-26 DIAGNOSIS — I700 Atherosclerosis of aorta: Secondary | ICD-10-CM | POA: Insufficient documentation

## 2022-05-26 DIAGNOSIS — K219 Gastro-esophageal reflux disease without esophagitis: Secondary | ICD-10-CM | POA: Diagnosis not present

## 2022-05-26 LAB — ABO AND RH

## 2022-05-26 MED ORDER — LIDOCAINE HCL 1 % IJ SOLN
INTRAMUSCULAR | Status: AC
  Administered 2022-05-26: 10 mL
  Filled 2022-05-26: qty 20

## 2022-05-26 MED ORDER — SODIUM CHLORIDE 0.9 % IV SOLN: INTRAVENOUS | Status: DC

## 2022-05-26 NOTE — Telephone Encounter (Signed)
.  Called pt per 6/12 inbaket , Patient was unavailable, a message with appt time and date was left with number on file.

## 2022-05-26 NOTE — Discharge Instructions (Signed)
   Please call Interventional Radiology clinic [phone] with any questions or concerns.  You may remove your dressing and shower tomorrow.  Needle Biopsy, Care After These instructions tell you how to care for yourself after your procedure. Your doctor may also give you more specific instructions. Call your doctor if you have any problems or questions. What can I expect after the procedure? After the procedure, it is common to have: Soreness. Bruising. Mild pain. Follow these instructions at home:  Return to your normal activities as told by your doctor. Ask your doctor what activities are safe for you. Take over-the-counter and prescription medicines only as told by your doctor. Wash your hands with soap and water before you change your bandage (dressing). If you cannot use soap and water, use hand sanitizer. Follow instructions from your doctor about: How to take care of your puncture site. When and how to change your bandage. When to remove your bandage. Check your puncture site every day for signs of infection. Watch for: Redness, swelling, or pain. Fluid or blood.  Pus or a bad smell. Warmth. Do not take baths, swim, or use a hot tub until your doctor approves. Ask your doctor if you may take showers. You may only be allowed to take sponge baths. Keep all follow-up visits as told by your doctor. This is important. Contact a doctor if you have: A fever. Redness, swelling, or pain at the puncture site, and it lasts longer than a few days. Fluid, blood, or pus coming from the puncture site. Warmth coming from the puncture site. Get help right away if: You have a lot of bleeding from the puncture site. Summary After the procedure, it is common to have soreness, bruising, or mild pain at the puncture site. Check your puncture site every day for signs of infection, such as redness, swelling, or pain. Get help right away if you have severe bleeding from your puncture  site. This information is not intended to replace advice given to you by your health care provider. Make sure you discuss any questions you have with your health care provider. Document Revised: 12/14/2017 Document Reviewed: 12/14/2017 Elsevier Patient Education  2020 [employer].

## 2022-05-26 NOTE — Progress Notes (Signed)
Pt seen results Via my chart  

## 2022-05-26 NOTE — Consult Note (Signed)
Chief Complaint: Patient was seen in consultation today for image guided subcutaneous nodule biopsy  Referring Physician(s): [name]  Supervising Physician: Jacqulynn Cadet  Patient Status: [facility] - Out-pt  History of Present Illness: Frances Bright is a 66 y.o. female with past medical history significant for anxiety, diverticulosis, GERD, hypothyroidism, remote melanoma back of neck, sleep apnea, IBS who presents now with history of weight loss and multiple subcutaneous nodules of uncertain etiology.  CT chest abdomen pelvis performed on 05/07/2022 revealed: 1. Numerous scattered small soft tissue nodules in the thoracic and abdominopelvic subcutaneous tissues, suspicious for metastatic disease. Suggest further evaluation with direct tissue sampling. 2. Solid irregular 6 mm left upper lobe pulmonary nodule, is nonspecific but concerning for metastatic disease. 3. Enhancing nodule in the gallbladder measuring 5 mm, nonspecific possibly reflecting a gallbladder polyp but also somewhat suspicious for metastasis. 4. Moderate volume of formed stool throughout the colon. 5.  Aortic Atherosclerosis  CT neck on 05/08/2022 revealed;   Single subcutaneous nodule in the upper right neck, likely solitary enlarged lymph node. The node is heterogeneous which could be from partial liquefaction/necrosis. No primary mass or infection seen, consider biopsy.  Patient has been referred to general surgery for resection /excisional biopsy of a few of the subcutaneous nodules however her appointment is not until next month.  She is anxious about obtaining a diagnosis sooner and has now been referred to IR for biopsy.  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 Dr Sharol Roussel   Anxiety    Arthritis    Atrophic vaginitis    Diverticulosis 02/2008   Enteritis, candida 2011   GERD  (gastroesophageal reflux disease)    Gluten intolerance 2012   H/O bilateral breast implants    Hypothyroidism    Melanoma ([place]) 2001   back of neck   PMB (postmenopausal bleeding) 09/2012   On HRT - PUS/ [facility] with fibroids   Sleep apnea     Past Surgical History:  Procedure Laterality Date   BREAST ENHANCEMENT SURGERY Bilateral 30 years ago   silicone, replaced with saline 6 years ago   CRYOTHERAPY     over 59yr ago for abnormal pap smear   MELANOMA EXCISION  2001   back of neck   MYOMECTOMY  about 1987   for fibroids   UMBILICAL HERNIA REPAIR N/A 07/31/2021   Procedure: [place]  Surgeon: BCoralie Keens MD;  Location: [facility]  Service: General;  Laterality: N/A;  LMA    Allergies: Prednisone and Cephalexin  Medications: Prior to Admission medications   Medication Sig Start Date End Date Taking? Authorizing Provider  liothyronine (CYTOMEL) 5 MCG tablet Take 5 mcg by mouth daily. Take 2 tablets every morning   Yes [provider]  levothyroxine (SYNTHROID) 50 MCG tablet Take 50 mcg by mouth daily before breakfast.    [provider]     Family History  Problem Relation Age of Onset   Hypertension Mother    Hyperlipidemia Mother    Cancer Father     Social History   Socioeconomic History   Marital status: Married    Spouse name: Not on file   Number of children: 0   Years of education: Not on file   Highest education level: Not on file  Occupational History   Not on file  Tobacco Use  Smoking status: Never   Smokeless tobacco: Never  Substance and Sexual Activity   Alcohol use: No   Drug use: No   Sexual activity: Not Currently    Partners: Male    Birth control/protection: Post-menopausal  Other Topics Concern   Not on file  Social History Narrative   Not on file   Social Determinants of Health   Financial Resource Strain: Not on file  Food Insecurity: Not on file  Transportation Needs: Not  on file  Physical Activity: Not on file  Stress: Not on file  Social Connections: Not on file      Review of Systems currently denies fever, headache, chest pain, dyspnea, cough, abdominal/back pain, nausea, vomiting or bleeding.  She has had some weight loss and is anxious.  Vital Signs: Ht '5\' 6"'$  (1.676 m)   Wt 105 lb 9.6 oz (47.9 kg)   LMP 12/15/2006   BMI 17.04 kg/m   Physical Exam awake, alert.  Chest clear to auscultation bilaterally.  Heart with regular rate and rhythm.  Abdomen soft, positive bowel sounds, nontender.  No lower extremity edema.  Scattered subcutaneous nodules noted on neck extremities ,chest ,abdomen.  Imaging: CT CHEST ABDOMEN PELVIS W CONTRAST  Result Date: 05/09/2022 CLINICAL DATA:  History of melanoma. Multiple skin nodules under left breast, upper abdomen and arms. * Tracking Code: BO * EXAM: CT CHEST, ABDOMEN, AND PELVIS WITH CONTRAST TECHNIQUE: Multidetector CT imaging of the chest, abdomen and pelvis was performed following the standard protocol during bolus administration of intravenous contrast. RADIATION DOSE REDUCTION: This exam was performed according to the departmental dose-optimization program which includes automated exposure control, adjustment of the mA and/or kV according to patient size and/or use of iterative reconstruction technique. CONTRAST:  142m OMNIPAQUE IOHEXOL 300 MG/ML  SOLN COMPARISON:  CT July 25, 2020 FINDINGS: CT CHEST FINDINGS Cardiovascular: Aortic atherosclerosis without aneurysmal dilation. No central pulmonary embolus on this nondedicated study. Normal size heart. No significant pericardial effusion/thickening. Mediastinum/Nodes: No suspicious thyroid nodule. No pathologically enlarged mediastinal, hilar or axillary lymph nodes. The esophagus is grossly unremarkable. Lungs/Pleura: Solid irregular 6 mm left upper lobe pulmonary nodule on image 28/5. No pleural effusion. No pneumothorax. Musculoskeletal: Bilateral breast  prostheses. Nodule in the left breast subjacent to the breast prosthesis measures 13 x 8 mm on image 52/3. Additional subcutaneous nodules further described below. CT ABDOMEN PELVIS FINDINGS Hepatobiliary: No suspicious hepatic lesion. Enhancing nodule in the gallbladder measuring 5 mm on image 40/6. No evidence of acute cholecystitis. No biliary ductal dilation. Pancreas: No pancreatic ductal dilation or evidence of acute inflammation. Spleen: No splenomegaly or focal splenic lesion. Adrenals/Urinary Tract: Bilateral adrenal glands appear normal. No hydronephrosis. Urinary bladder is unremarkable for degree of distension. Stomach/Bowel: Radiopaque enteric contrast material traverses the sigmoid colon. Stomach is unremarkable for degree of distension. No pathologic dilation of large or small bowel. No evidence of acute bowel inflammation. Moderate volume of formed stool throughout the colon. Vascular/Lymphatic: Normal caliber abdominal aorta. No pathologically enlarged abdominal or pelvic lymph nodes. Reproductive: Uterus and bilateral adnexa are unremarkable. Other: Numerous scattered small soft tissue nodules in the abdominopelvic subcutaneous fat. For reference: -Right anterior subcutaneous nodule measures 9 mm on image 64/3 and another measuring 5 mm on image 81/3. -Subcutaneous nodule posterior to the right gluteal muscle measures 10 mm on image 128/3 -Nodule to the left gluteal musculature measuring 8 mm on image 125/3. Musculoskeletal: No aggressive lytic or blastic lesion of bone. IMPRESSION: 1. Numerous scattered small soft tissue nodules  in the thoracic and abdominopelvic subcutaneous tissues, suspicious for metastatic disease. Suggest further evaluation with direct tissue sampling. 2. Solid irregular 6 mm left upper lobe pulmonary nodule, is nonspecific but concerning for metastatic disease. 3. Enhancing nodule in the gallbladder measuring 5 mm, nonspecific possibly reflecting a gallbladder polyp but also  somewhat suspicious for metastasis. 4. Moderate volume of formed stool throughout the colon. 5.  Aortic Atherosclerosis (ICD10-I70.0). These results will be called to the ordering clinician or representative by the Radiologist Assistant, and communication documented in the PACS or [employer]. Electronically Signed   By: Dahlia Bailiff M.D.   On: 05/09/2022 13:19   CT Soft Tissue Neck W Contrast  Result Date: 05/08/2022 CLINICAL DATA:  Right neck mask and sin lesions. EXAM: CT NECK WITH CONTRAST TECHNIQUE: Multidetector CT imaging of the neck was performed using the standard protocol following the bolus administration of intravenous contrast. RADIATION DOSE REDUCTION: This exam was performed according to the departmental dose-optimization program which includes automated exposure control, adjustment of the mA and/or kV according to patient size and/or use of iterative reconstruction technique. CONTRAST:  189m OMNIPAQUE IOHEXOL 300 MG/ML  SOLN COMPARISON:  None Available. FINDINGS: Pharynx and larynx: No evidence of mass or inflammation. Salivary glands: No inflammation, mass, or stone. Thyroid: Subcentimeter nodules measuring up to 6 mm on the right. No followup recommended (ref: J Am Coll Radiol. 2015 Feb;12(2): 143-50). Lymph nodes: Palpable complaint correlates with a right upper subcutaneous nodule which measures up to 13 mm and shows heterogeneity from low-density components. No additional adenopathy seen throughout the neck. Vascular: Negative Limited intracranial: Negative Visualized orbits: Negative Mastoids and visualized paranasal sinuses: Clear Skeleton: Ordinary cervical spine degeneration Upper chest: Clear apical lungs IMPRESSION: Single subcutaneous nodule in the upper right neck, likely solitary enlarged lymph node. The node is heterogeneous which could be from partial liquefaction/necrosis. No primary mass or infection seen, consider biopsy. Electronically Signed   By: JJorje GuildM.D.    On: 05/08/2022 06:25    Labs:  CBC: Recent Labs    11/18/21 1152 04/18/22 1435  WBC 3.8* 3.7*  HGB 13.3 12.8  HCT 39.4 38.8  PLT 232.0 216    COAGS: No results for input(s): "INR", "APTT" in the last 8760 hours.  BMP: Recent Labs    11/18/21 1152 04/18/22 1435  NA 140 138  K 4.2 4.0  CL 101 104  CO2 31 28  GLUCOSE 91 93  BUN 14 22  CALCIUM 9.5 9.2  CREATININE 0.73 0.74  GFRNONAA  --  >60    LIVER FUNCTION TESTS: Recent Labs    11/18/21 1152 04/18/22 1435  BILITOT 1.1 1.2  AST 14 22  ALT 13 22  ALKPHOS 76 75  PROT 6.9 7.1  ALBUMIN 4.6 4.6    TUMOR MARKERS: No results for input(s): "AFPTM", "CEA", "CA199", "CHROMGRNA" in the last 8760 hours.  Assessment and Plan: 66y.o. female with past medical history significant for anxiety, diverticulosis, GERD, hypothyroidism, remote melanoma back of neck, sleep apnea, IBS who presents now with history of weight loss and multiple subcutaneous nodules of uncertain etiology.  CT chest abdomen pelvis performed on 05/07/2022 revealed: 1. Numerous scattered small soft tissue nodules in the thoracic and abdominopelvic subcutaneous tissues, suspicious for metastatic disease. Suggest further evaluation with direct tissue sampling. 2. Solid irregular 6 mm left upper lobe pulmonary nodule, is nonspecific but concerning for metastatic disease. 3. Enhancing nodule in the gallbladder measuring 5 mm, nonspecific possibly reflecting a gallbladder polyp  but also somewhat suspicious for metastasis. 4. Moderate volume of formed stool throughout the colon. 5.  Aortic Atherosclerosis  CT neck on 05/08/2022 revealed;   Single subcutaneous nodule in the upper right neck, likely solitary enlarged lymph node. The node is heterogeneous which could be from partial liquefaction/necrosis. No primary mass or infection seen, consider biopsy.  Patient has been referred to general surgery for resection /excisional biopsy of a few of the  subcutaneous nodules however her appointment is not until next month.  She is anxious about obtaining a diagnosis sooner and has now been referred to IR for biopsy.Risks and benefits of procedure was discussed with the patient  including, but not limited to bleeding, infection, damage to adjacent structures or low yield requiring additional tests.  All of the questions were answered and there is agreement to proceed.  Consent signed and in chart.    Thank you for this interesting consult.  I greatly enjoyed meeting Frances Bright and look forward to participating in their care.  A copy of this report was sent to the requesting provider on this date.  Electronically Signed: D. Rowe Robert, PA-C 05/26/2022, 12:14 PM   I spent a total of 25 minutes    in face to face in clinical consultation, greater than 50% of which was counseling/coordinating care for image guided biopsy of subcutaneous nodule

## 2022-05-27 LAB — CYTOLOGY - NON PAP

## 2022-05-28 NOTE — Progress Notes (Signed)
Requested Foundation 1, BRAF and PDL 1 testing on tissue sample from 05/26/22 core needle biopsy/ Per Dr Irene Limbo

## 2022-05-30 ENCOUNTER — Other Ambulatory Visit: Payer: Self-pay

## 2022-05-30 ENCOUNTER — Encounter (HOSPITAL_COMMUNITY): Payer: Self-pay | Admitting: Surgery

## 2022-05-30 ENCOUNTER — Inpatient Hospital Stay: Payer: Medicare HMO | Attending: Hematology | Admitting: Hematology

## 2022-05-30 ENCOUNTER — Ambulatory Visit: Payer: Medicare HMO | Admitting: Hematology

## 2022-05-30 VITALS — BP 120/67 | HR 86 | Temp 97.9°F | Resp 16 | Ht 66.0 in | Wt 105.7 lb

## 2022-05-30 DIAGNOSIS — K219 Gastro-esophageal reflux disease without esophagitis: Secondary | ICD-10-CM | POA: Diagnosis not present

## 2022-05-30 DIAGNOSIS — I700 Atherosclerosis of aorta: Secondary | ICD-10-CM | POA: Insufficient documentation

## 2022-05-30 DIAGNOSIS — Z79899 Other long term (current) drug therapy: Secondary | ICD-10-CM | POA: Insufficient documentation

## 2022-05-30 DIAGNOSIS — E039 Hypothyroidism, unspecified: Secondary | ICD-10-CM | POA: Diagnosis not present

## 2022-05-30 DIAGNOSIS — C792 Secondary malignant neoplasm of skin: Secondary | ICD-10-CM | POA: Diagnosis not present

## 2022-05-30 DIAGNOSIS — Z881 Allergy status to other antibiotic agents status: Secondary | ICD-10-CM | POA: Diagnosis not present

## 2022-05-30 DIAGNOSIS — C434 Malignant melanoma of scalp and neck: Secondary | ICD-10-CM | POA: Diagnosis not present

## 2022-05-30 DIAGNOSIS — Z79890 Hormone replacement therapy (postmenopausal): Secondary | ICD-10-CM | POA: Insufficient documentation

## 2022-05-30 DIAGNOSIS — Z808 Family history of malignant neoplasm of other organs or systems: Secondary | ICD-10-CM | POA: Diagnosis not present

## 2022-05-30 DIAGNOSIS — Z888 Allergy status to other drugs, medicaments and biological substances status: Secondary | ICD-10-CM | POA: Diagnosis not present

## 2022-05-30 DIAGNOSIS — Z681 Body mass index (BMI) 19 or less, adult: Secondary | ICD-10-CM | POA: Diagnosis not present

## 2022-05-30 DIAGNOSIS — Z8249 Family history of ischemic heart disease and other diseases of the circulatory system: Secondary | ICD-10-CM | POA: Insufficient documentation

## 2022-05-30 DIAGNOSIS — R634 Abnormal weight loss: Secondary | ICD-10-CM | POA: Insufficient documentation

## 2022-05-30 DIAGNOSIS — Z8349 Family history of other endocrine, nutritional and metabolic diseases: Secondary | ICD-10-CM | POA: Diagnosis not present

## 2022-05-30 DIAGNOSIS — M1990 Unspecified osteoarthritis, unspecified site: Secondary | ICD-10-CM | POA: Diagnosis not present

## 2022-05-30 DIAGNOSIS — K589 Irritable bowel syndrome without diarrhea: Secondary | ICD-10-CM | POA: Insufficient documentation

## 2022-05-30 DIAGNOSIS — C439 Malignant melanoma of skin, unspecified: Secondary | ICD-10-CM | POA: Diagnosis not present

## 2022-05-30 NOTE — Progress Notes (Signed)
PCP - Richardean Sale Cardiologist - denies  PPM/ICD - denies  Chest x-ray - n/a EKG - n/a Stress Test - denies ECHO - denies Cardiac Cath - denies  CPAP - patient denied sleep apnea but in her records there is listed OSA  Fasting Blood Sugar - n/a  Blood Thinner Instructions: n/a  ERAS Protcol - yes, until 09:30 o'clock  COVID TEST- n/a  Anesthesia review: no  Patient verbally denies any shortness of breath, fever, cough and chest pain during phone call   -------------  SDW INSTRUCTIONS given:  Your procedure is scheduled on Monday, June 19th, 2023.  Report to [facility] Main Entrance "A" at 10:00 A.M., and check in at the Admitting office.  Call this number if you have problems the morning of surgery:  [phone]   Remember:  Do not eat after midnight the night before your surgery  You may drink clear liquids until 09:30 the morning of your surgery.   Clear liquids allowed are: Water, Non-Citrus Juices (without pulp), Carbonated Beverages, Clear Tea, Black Coffee Only, and Gatorade    Take these medicines the morning of surgery with A SIP OF WATER Synthroid, Cytomel, eye drops  As of today, STOP taking any Aspirin (unless otherwise instructed by your surgeon) Aleve, Naproxen, Ibuprofen, Motrin, Advil, Goody's, BC's, all herbal medications, fish oil, and all vitamins.   The day of surgery:                     Do not wear jewelry, make up, or nail polish            Do not wear lotions, powders, perfumes, or deodorant.            Do not shave 48 hours prior to surgery.              Do not bring valuables to the hospital.            [facility] is not responsible for any belongings or valuables.  Do NOT Smoke (Tobacco/Vaping) 24 hours prior to your procedure If you use a CPAP at night, you may bring all equipment for your overnight stay.   Contacts, glasses, dentures or bridgework may not be worn into surgery.      For patients admitted to the hospital,  discharge time will be determined by your treatment team.   Patients discharged the day of surgery will not be allowed to drive home, and someone needs to stay with them for 24 hours.   Special instructions:   [place]- Preparing For Surgery  Before surgery, you can play an important role. Because skin is not sterile, your skin needs to be as free of germs as possible. You can reduce the number of germs on your skin by washing with CHG (chlorahexidine gluconate) Soap before surgery.  CHG is an antiseptic cleaner which kills germs and bonds with the skin to continue killing germs even after washing.    Oral Hygiene is also important to reduce your risk of infection.  Remember - BRUSH YOUR TEETH THE MORNING OF SURGERY WITH YOUR REGULAR TOOTHPASTE  Please do not use if you have an allergy to CHG or antibacterial soaps. If your skin becomes reddened/irritated stop using the CHG.  Do not shave (including legs and underarms) for at least 48 hours prior to first CHG shower. It is OK to shave your face.  Please follow these instructions carefully.   Shower the [employer] SURGERY and the  MORNING OF SURGERY with DIAL Soap.   Pat yourself dry with a CLEAN TOWEL.  Wear CLEAN PAJAMAS to bed the night before surgery  Place CLEAN SHEETS on your bed the night of your first shower and DO NOT SLEEP WITH PETS.   Day of Surgery: Please shower morning of surgery  Wear Clean/Comfortable clothing the morning of surgery Do not apply any deodorants/lotions.   Remember to brush your teeth WITH YOUR REGULAR TOOTHPASTE.   Questions were answered. Patient verbalized understanding of instructions.

## 2022-05-31 NOTE — H&P (Signed)
REFERRING PHYSICIAN: Loren Racer, MD  PROVIDER: Beverlee Nims, MD  MRN: [id number] DOB: 04-09-1956 DATE OF ENCOUNTER: 04/18/2022 Subjective   Chief Complaint: New Consultation (Lymph Nodes)   History of Present Illness: Frances Bright is a 66 y.o. female who is seen  as an office consultation for evaluation of New Consultation (Lymph Nodes) .   This is a 66 year old female who I have performed umbilical hernia surgery on the past who is referred here for lymphadenopathy. She reports that she started noticing enlarged lymph nodes in her neck and also on her body in late March. She reports she has had some weight loss. She denies fevers or chills but felt like she was developing infection so has been on doxycycline. She denies any oral problems. She reports her bowels move normally. The denies night sweats. She has no other complaints.  Review of Systems: A complete review of systems was obtained from the patient. I have reviewed this information and discussed as appropriate with the patient. See HPI as well for other ROS.  ROS   Medical History: History reviewed. No pertinent past medical history.  Patient Active Problem List  Diagnosis   Abnormal feces   Abnormal weight loss   Celiac disease   Chronic venous insufficiency   Change in bowel habit   Diverticular disease of colon   Esophageal reflux   History of breast augmentation   Hypothyroid   Irritable bowel syndrome   Leukocytopenia, unspecified   Melanoma (CMS-HCC)   Periumbilical pain   Physical exam   Rash and other nonspecific skin eruption   Vitamin D deficiency   Past Surgical History:  Procedure Laterality Date   MASTECTOMY    Allergies  Allergen Reactions   Cephalexin Rash   Ciprofloxacin Rash   No current outpatient medications on file prior to visit.   No current facility-administered medications on file prior to visit.   Family History  Problem Relation Age of Onset   High  blood pressure (Hypertension) Mother   Myocardial Infarction (Heart attack) Mother    Social History   Tobacco Use  Smoking Status Never  Smokeless Tobacco Never    Social History   Socioeconomic History   Marital status: Married  Tobacco Use   Smoking status: Never   Smokeless tobacco: Never  Vaping Use   Vaping Use: Never used  Substance and Sexual Activity   Alcohol use: Never   Drug use: Never   Objective:   Vitals:   BP: 118/80  Pulse: 93  Temp: 36.2 C (97.1 F)  SpO2: 99%  Weight: 49.1 kg (108 lb 3.2 oz)  Height: 166.4 cm (5' 5.5")   Body mass index is 17.73 kg/m.  Physical Exam   She appears well on exam.  Is a 2 cm mass at the angle of the jaw on the right neck which is mobile. There is shotty lymphadenopathy on both sides of her neck. There is lymphadenopathy in both inguinal areas. I cannot feel any enlarged axillary lymph nodes. She also has a few subcutaneous masses on her right lower chest/abdominal wall  Labs, Imaging and Diagnostic Testing: I have laboratory data shows her to have a normal white blood count and normal hemoglobin as well as a normal creatinine  Assessment and Plan:   Diagnoses and all orders for this visit:  Lymphadenopathy    This is lymphadenopathy of uncertain etiology. She already has an appointment with oncology today and I will hold on scheduling her for any  excisional biopsy of the lymph node until they have determine whether or not other work-up needs to be performed first. The mass at the angle of the jaw may be a lymph node or an enlarged salivary gland. Her findings are worrisome for malignancy. We will stay on contact and determine how to proceed next after she has seen medical oncology.   Addendum:  left thigh FNA showed metastatic melanoma.  Will plan excisional biopsy of several more of the nodules in the OR if oncology still desires.  Risks were discussed with the patient in detail.

## 2022-06-02 ENCOUNTER — Ambulatory Visit: Payer: Medicare HMO | Admitting: Hematology

## 2022-06-02 ENCOUNTER — Encounter (HOSPITAL_COMMUNITY)
Admission: RE | Disposition: A | Discharge status: Home / Self Care | Payer: Self-pay | Source: Home / Self Care | Attending: Surgery

## 2022-06-02 ENCOUNTER — Other Ambulatory Visit: Payer: Self-pay

## 2022-06-02 ENCOUNTER — Ambulatory Visit (HOSPITAL_BASED_OUTPATIENT_CLINIC_OR_DEPARTMENT_OTHER): Payer: Medicare HMO | Admitting: Certified Registered Nurse Anesthetist

## 2022-06-02 ENCOUNTER — Ambulatory Visit (HOSPITAL_COMMUNITY)
Admission: RE | Admit: 2022-06-02 | Discharge: 2022-06-02 | Disposition: A | Discharge status: Home / Self Care | Payer: Medicare HMO | Attending: Surgery | Admitting: Surgery

## 2022-06-02 ENCOUNTER — Encounter (HOSPITAL_COMMUNITY): Payer: Self-pay | Admitting: Surgery

## 2022-06-02 ENCOUNTER — Ambulatory Visit (HOSPITAL_COMMUNITY): Payer: Medicare HMO | Admitting: Certified Registered Nurse Anesthetist

## 2022-06-02 DIAGNOSIS — E039 Hypothyroidism, unspecified: Secondary | ICD-10-CM

## 2022-06-02 DIAGNOSIS — R221 Localized swelling, mass and lump, neck: Secondary | ICD-10-CM | POA: Diagnosis not present

## 2022-06-02 DIAGNOSIS — R222 Localized swelling, mass and lump, trunk: Secondary | ICD-10-CM

## 2022-06-02 DIAGNOSIS — C439 Malignant melanoma of skin, unspecified: Secondary | ICD-10-CM | POA: Diagnosis not present

## 2022-06-02 DIAGNOSIS — M7989 Other specified soft tissue disorders: Secondary | ICD-10-CM | POA: Diagnosis not present

## 2022-06-02 DIAGNOSIS — G4730 Sleep apnea, unspecified: Secondary | ICD-10-CM | POA: Diagnosis not present

## 2022-06-02 DIAGNOSIS — M1990 Unspecified osteoarthritis, unspecified site: Secondary | ICD-10-CM | POA: Diagnosis not present

## 2022-06-02 DIAGNOSIS — C792 Secondary malignant neoplasm of skin: Secondary | ICD-10-CM | POA: Insufficient documentation

## 2022-06-02 DIAGNOSIS — R2232 Localized swelling, mass and lump, left upper limb: Secondary | ICD-10-CM | POA: Diagnosis not present

## 2022-06-02 HISTORY — PX: MASS EXCISION: SHX2000

## 2022-06-02 LAB — BASIC METABOLIC PANEL
Anion gap: 12 (ref 5–15)
BUN: 20 mg/dL (ref 8–23)
CO2: 25 mmol/L (ref 22–32)
Calcium: 9 mg/dL (ref 8.9–10.3)
Chloride: 102 mmol/L (ref 98–111)
Creatinine, Ser: 0.78 mg/dL (ref 0.44–1.00)
GFR, Estimated: >60 mL/min (ref >60)
Glucose, Bld: 99 mg/dL (ref 70–99)
Potassium: 3.8 mmol/L (ref 3.5–5.1)
Sodium: 139 mmol/L (ref 135–145)

## 2022-06-02 LAB — CBC
HCT: 37.4 % (ref 36.0–46.0)
Hemoglobin: 12.8 g/dL (ref 12.0–15.0)
MCH: 29.9 pg (ref 26.0–34.0)
MCHC: 34.2 g/dL (ref 30.0–36.0)
MCV: 87.4 fL (ref 80.0–100.0)
Platelets: 177 10*3/uL (ref 150–400)
RBC: 4.28 MIL/uL (ref 3.87–5.11)
RDW: 12.3 % (ref 11.5–15.5)
WBC: 3.5 10*3/uL — ABNORMAL LOW (ref 4.0–10.5)
nRBC: 0 % (ref 0.0–0.2)

## 2022-06-02 SURGERY — EXCISION MASS
Anesthesia: General

## 2022-06-02 MED ORDER — EPHEDRINE SULFATE (PRESSORS) 50 MG/ML IJ SOLN
INTRAMUSCULAR | Status: DC | PRN
  Administered 2022-06-02 (×3): 5 mg via INTRAVENOUS

## 2022-06-02 MED ORDER — LIDOCAINE 2% (20 MG/ML) 5 ML SYRINGE
INTRAMUSCULAR | Status: DC | PRN
  Administered 2022-06-02: 40 mg via INTRAVENOUS

## 2022-06-02 MED ORDER — PROTAMINE SULFATE 10 MG/ML IV SOLN
INTRAVENOUS | Status: AC
Start: 2022-06-02 — End: ?
  Filled 2022-06-02: qty 5

## 2022-06-02 MED ORDER — CIPROFLOXACIN IN D5W 400 MG/200ML IV SOLN
400.0000 mg | INTRAVENOUS | Status: AC
  Administered 2022-06-02: 400 mg via INTRAVENOUS

## 2022-06-02 MED ORDER — LIDOCAINE HCL (PF) 1 % IJ SOLN
INTRAMUSCULAR | Status: AC
  Filled 2022-06-02: qty 30

## 2022-06-02 MED ORDER — ACETAMINOPHEN 500 MG PO TABS
ORAL_TABLET | ORAL | Status: AC
  Administered 2022-06-02: 1000 mg via ORAL
  Filled 2022-06-02: qty 2

## 2022-06-02 MED ORDER — LEVOTHYROXINE SODIUM 50 MCG PO TABS: 50.0000 ug | ORAL_TABLET | Freq: Every day | ORAL | 0 refills | Status: DC

## 2022-06-02 MED ORDER — CHLORHEXIDINE GLUCONATE CLOTH 2 % EX PADS: 6.0000 | MEDICATED_PAD | Freq: Once | CUTANEOUS | Status: DC

## 2022-06-02 MED ORDER — TRAMADOL HCL 50 MG PO TABS: 50.0000 mg | ORAL_TABLET | Freq: Four times a day (QID) | ORAL | 0 refills | Status: DC | PRN

## 2022-06-02 MED ORDER — EPHEDRINE 5 MG/ML INJ
INTRAVENOUS | Status: AC
  Filled 2022-06-02: qty 5

## 2022-06-02 MED ORDER — ONDANSETRON HCL 4 MG/2ML IJ SOLN
INTRAMUSCULAR | Status: DC | PRN
  Administered 2022-06-02: 4 mg via INTRAVENOUS

## 2022-06-02 MED ORDER — LACTATED RINGERS IV SOLN: INTRAVENOUS | Status: DC

## 2022-06-02 MED ORDER — ACETAMINOPHEN 500 MG PO TABS: 1000.0000 mg | ORAL_TABLET | ORAL | Status: AC

## 2022-06-02 MED ORDER — PHENYLEPHRINE 80 MCG/ML (10ML) SYRINGE FOR IV PUSH (FOR BLOOD PRESSURE SUPPORT)
PREFILLED_SYRINGE | INTRAVENOUS | Status: DC | PRN
  Administered 2022-06-02: 80 ug via INTRAVENOUS

## 2022-06-02 MED ORDER — PHENYLEPHRINE 80 MCG/ML (10ML) SYRINGE FOR IV PUSH (FOR BLOOD PRESSURE SUPPORT)
PREFILLED_SYRINGE | INTRAVENOUS | Status: AC
Start: 2022-06-02 — End: ?
  Filled 2022-06-02: qty 20

## 2022-06-02 MED ORDER — ORAL CARE MOUTH RINSE: 15.0000 mL | Freq: Once | OROMUCOSAL | Status: AC

## 2022-06-02 MED ORDER — FENTANYL CITRATE (PF) 250 MCG/5ML IJ SOLN
INTRAMUSCULAR | Status: AC
  Filled 2022-06-02: qty 5

## 2022-06-02 MED ORDER — HEPARIN SODIUM (PORCINE) 1000 UNIT/ML IJ SOLN
INTRAMUSCULAR | Status: AC
Start: 2022-06-02 — End: ?
  Filled 2022-06-02: qty 10

## 2022-06-02 MED ORDER — DEXAMETHASONE SODIUM PHOSPHATE 10 MG/ML IJ SOLN
INTRAMUSCULAR | Status: AC
  Filled 2022-06-02: qty 1

## 2022-06-02 MED ORDER — CHLORHEXIDINE GLUCONATE 0.12 % MT SOLN: 15.0000 mL | Freq: Once | OROMUCOSAL | Status: AC

## 2022-06-02 MED ORDER — DEXAMETHASONE SODIUM PHOSPHATE 10 MG/ML IJ SOLN
INTRAMUSCULAR | Status: DC | PRN
  Administered 2022-06-02: 5 mg via INTRAVENOUS

## 2022-06-02 MED ORDER — BUPIVACAINE HCL 0.5 % IJ SOLN
INTRAMUSCULAR | Status: DC | PRN
  Administered 2022-06-02: 15 mL

## 2022-06-02 MED ORDER — LIDOCAINE 2% (20 MG/ML) 5 ML SYRINGE
INTRAMUSCULAR | Status: AC
Start: 2022-06-02 — End: ?
  Filled 2022-06-02: qty 5

## 2022-06-02 MED ORDER — CHLORHEXIDINE GLUCONATE 0.12 % MT SOLN
OROMUCOSAL | Status: AC
  Administered 2022-06-02: 15 mL via OROMUCOSAL
  Filled 2022-06-02: qty 15

## 2022-06-02 MED ORDER — MIDAZOLAM HCL 2 MG/2ML IJ SOLN
INTRAMUSCULAR | Status: AC
Start: 2022-06-02 — End: ?
  Filled 2022-06-02: qty 2

## 2022-06-02 MED ORDER — FENTANYL CITRATE (PF) 100 MCG/2ML IJ SOLN
INTRAMUSCULAR | Status: DC | PRN
Start: 2022-06-02 — End: 2022-06-02
  Administered 2022-06-02: 25 ug via INTRAVENOUS

## 2022-06-02 MED ORDER — PROPOFOL 10 MG/ML IV BOLUS
INTRAVENOUS | Status: AC
  Filled 2022-06-02: qty 20

## 2022-06-02 MED ORDER — PROPOFOL 10 MG/ML IV BOLUS
INTRAVENOUS | Status: DC | PRN
  Administered 2022-06-02: 110 mg via INTRAVENOUS
  Administered 2022-06-02: 20 mg via INTRAVENOUS

## 2022-06-02 MED ORDER — 0.9 % SODIUM CHLORIDE (POUR BTL) OPTIME
TOPICAL | Status: DC | PRN
  Administered 2022-06-02: 1000 mL

## 2022-06-02 MED ORDER — LIOTHYRONINE SODIUM 5 MCG PO TABS: 10.0000 ug | ORAL_TABLET | Freq: Every day | ORAL | 0 refills | Status: DC

## 2022-06-02 MED ORDER — BUPIVACAINE HCL (PF) 0.5 % IJ SOLN
INTRAMUSCULAR | Status: AC
  Filled 2022-06-02: qty 30

## 2022-06-02 MED ORDER — DEXAMETHASONE SODIUM PHOSPHATE 10 MG/ML IJ SOLN
INTRAMUSCULAR | Status: AC
  Filled 2022-06-02: qty 2

## 2022-06-02 MED ORDER — ONDANSETRON HCL 4 MG/2ML IJ SOLN
INTRAMUSCULAR | Status: AC
Start: 2022-06-02 — End: ?
  Filled 2022-06-02: qty 2

## 2022-06-02 MED ORDER — LIDOCAINE 2% (20 MG/ML) 5 ML SYRINGE
INTRAMUSCULAR | Status: AC
  Filled 2022-06-02: qty 10

## 2022-06-02 MED ORDER — CIPROFLOXACIN IN D5W 400 MG/200ML IV SOLN
INTRAVENOUS | Status: AC
  Filled 2022-06-02: qty 200

## 2022-06-02 SURGICAL SUPPLY — 37 items
ADH SKN CLS APL DERMABOND .7 (GAUZE/BANDAGES/DRESSINGS) ×3
BAG COUNTER SPONGE SURGICOUNT (BAG) ×2 IMPLANT
BAG SPNG CNTER NS LX DISP (BAG) ×1
CANISTER SUCT 3000ML PPV (MISCELLANEOUS) IMPLANT
CNTNR URN SCR LID CUP LEK RST (MISCELLANEOUS) IMPLANT
CONT SPEC 4OZ STRL OR WHT (MISCELLANEOUS) ×8
COVER SURGICAL LIGHT HANDLE (MISCELLANEOUS) ×2 IMPLANT
DERMABOND ADVANCED (GAUZE/BANDAGES/DRESSINGS) ×3
DERMABOND ADVANCED .7 DNX12 (GAUZE/BANDAGES/DRESSINGS) ×1 IMPLANT
DRAPE LAPAROSCOPIC ABDOMINAL (DRAPES) IMPLANT
DRAPE LAPAROTOMY 100X72 PEDS (DRAPES) IMPLANT
DRSG TEGADERM 4X4.75 (GAUZE/BANDAGES/DRESSINGS) ×2 IMPLANT
ELECT REM PT RETURN 9FT ADLT (ELECTROSURGICAL) ×2
ELECTRODE REM PT RTRN 9FT ADLT (ELECTROSURGICAL) ×1 IMPLANT
GAUZE SPONGE 4X4 12PLY STRL (GAUZE/BANDAGES/DRESSINGS) ×2 IMPLANT
GLOVE SURG SIGNA 7.5 PF LTX (GLOVE) ×2 IMPLANT
GOWN STRL REUS W/ TWL LRG LVL3 (GOWN DISPOSABLE) ×1 IMPLANT
GOWN STRL REUS W/ TWL XL LVL3 (GOWN DISPOSABLE) ×1 IMPLANT
GOWN STRL REUS W/TWL LRG LVL3 (GOWN DISPOSABLE) ×2
GOWN STRL REUS W/TWL XL LVL3 (GOWN DISPOSABLE) ×2
KIT BASIN OR (CUSTOM PROCEDURE TRAY) ×2 IMPLANT
KIT TURNOVER KIT B (KITS) ×2 IMPLANT
NDL HYPO 25GX1X1/2 BEV (NEEDLE) ×1 IMPLANT
NEEDLE HYPO 25GX1X1/2 BEV (NEEDLE) ×2 IMPLANT
NS IRRIG 1000ML POUR BTL (IV SOLUTION) ×2 IMPLANT
PACK GENERAL/GYN (CUSTOM PROCEDURE TRAY) ×2 IMPLANT
PACK UNIVERSAL I (CUSTOM PROCEDURE TRAY) ×1 IMPLANT
PAD ARMBOARD 7.5X6 YLW CONV (MISCELLANEOUS) ×2 IMPLANT
PENCIL SMOKE EVACUATOR (MISCELLANEOUS) ×2 IMPLANT
SPECIMEN JAR SMALL (MISCELLANEOUS) ×2 IMPLANT
SUT MNCRL AB 4-0 PS2 18 (SUTURE) ×4 IMPLANT
SUT VIC AB 3-0 SH 27 (SUTURE) ×10
SUT VIC AB 3-0 SH 27X BRD (SUTURE) IMPLANT
SUT VIC AB 3-0 SH 27XBRD (SUTURE) ×1 IMPLANT
SYR CONTROL 10ML LL (SYRINGE) ×2 IMPLANT
TOWEL GREEN STERILE (TOWEL DISPOSABLE) ×2 IMPLANT
TOWEL GREEN STERILE FF (TOWEL DISPOSABLE) ×2 IMPLANT

## 2022-06-02 NOTE — Anesthesia Preprocedure Evaluation (Addendum)
Anesthesia Evaluation  Patient identified by MRN, date of birth, ID band Patient awake    Reviewed: Allergy & Precautions, NPO status , Patient's Chart, lab work & pertinent test results  History of Anesthesia Complications Negative for: history of anesthetic complications  Airway Mallampati: II  TM Distance: >3 FB Neck ROM: Full    Dental  (+) Dental Advisory Given, Teeth Intact   Pulmonary sleep apnea ,    breath sounds clear to auscultation       Cardiovascular negative cardio ROS   Rhythm:Regular     Neuro/Psych Anxiety negative neurological ROS     GI/Hepatic Neg liver ROS, GERD  ,  Endo/Other  Hypothyroidism   Renal/GU negative Renal ROS     Musculoskeletal  (+) Arthritis ,   Abdominal   Peds  Hematology Lab Results      Component                Value               Date                      WBC                      3.5 (L)             06/02/2022                HGB                      12.8                06/02/2022                HCT                      37.4                06/02/2022                MCV                      87.4                06/02/2022                PLT                      177                 06/02/2022              Anesthesia Other Findings Melanoma   Reproductive/Obstetrics                            Anesthesia Physical Anesthesia Plan  ASA: 3  Anesthesia Plan: General   Post-op Pain Management: Tylenol PO (pre-op)*   Induction: Intravenous  PONV Risk Score and Plan: 3 and Ondansetron and Dexamethasone  Airway Management Planned: LMA  Additional Equipment: None  Intra-op Plan:   Post-operative Plan: Extubation in OR  Informed Consent: I have reviewed the patients History and Physical, chart, labs and discussed the procedure including the risks, benefits and alternatives for the proposed anesthesia with the patient or authorized representative  who has indicated his/her understanding and acceptance.     Dental  advisory given  Plan Discussed with: CRNA  Anesthesia Plan Comments:         Anesthesia Quick Evaluation

## 2022-06-02 NOTE — Transfer of Care (Signed)
Immediate Anesthesia Transfer of Care Note  Patient: Frances TERRITO  Procedure(s) Performed: EXCISION SKIN NODULES LEFT BREAST, Left Arm,ABDOMINAL WALL, AND RIGHT NECK  Patient Location: PACU  Anesthesia Type:General  Level of Consciousness: awake and alert   Airway & Oxygen Therapy: Patient Spontanous Breathing  Post-op Assessment: Report given to RN and Post -op Vital signs reviewed and stable  Post vital signs: Reviewed and stable  Last Vitals:  Vitals Value Taken Time  BP 122/78 06/02/22 1358  Temp 36.4 C 06/02/22 1358  Pulse 86 06/02/22 1404  Resp 20 06/02/22 1404  SpO2 97 % 06/02/22 1404  Vitals shown include unvalidated device data.  Last Pain:  Vitals:   06/02/22 1358  TempSrc:   PainSc: 0-No pain         Complications: No notable events documented.

## 2022-06-02 NOTE — Discharge Instructions (Signed)
Ok to shower starting tomorrow  Ice pack, tylenol, and ibuprofen also for pain  No vigorous activity for one week

## 2022-06-02 NOTE — Op Note (Signed)
EXCISION SKIN NODULES LEFT BREAST, Left Arm,ABDOMINAL WALL, AND RIGHT NECK  Procedure Note  [name] 06/02/2022   Pre-op Diagnosis: MULTIPLE SUBCUTANEOUS SKIN NODULES, RULE OUT MALIGNANCY     Post-op Diagnosis: same  Procedure(s): EXCISION 1 CM SUBCUTANEOUS NODULE RIGHT NECK EXCISION  SUBCUTANEOUS NODULES X 4 LEFT UPPER ARM (ALL 7 MM) EXCISION  1 CM SUBCUTANEOUS NODULE LEFT BREAST AND 2  7 MM SUBCUTANEOUS NODULES ABDOMINAL WALL  Surgeon(s): Coralie Keens, MD  Anesthesia: General  Staff:  Circulator: Dory Peru, RN; Wynn Banker, RN Relief Circulator: Mellody Drown, RN Scrub Person: Cruz, Shirlean Mylar, RN  Estimated Blood Loss: Minimal               Specimens: SENT TO PATH  Indications: This is a 66 year old female who presents with multiple subcutaneous nodules including her neck, left breast, left arm, and abdominal wall.  She has had a fine-needle aspiration of a nodule on her thigh which showed metastatic melanoma.  It is suspected that all these nodules are metastatic disease.  Excision has been recommended  Findings: All of the lesions had dark, pigmented and was liquefied tissue consistent with melanoma  Procedure: The patient is brought to operating identifies correct patient.  She is placed upon the operating table and general anesthesia was induced.  Her neck, chest, abdomen, and left arm were prepped and draped in usual sterile fashion.  I anesthetized the skin on the abdominal wall in 2 separate areas and along the left lower breast all with Marcaine.  I made an incision in each of the areas and then dissected and subcutaneous tissue.  There were dark pigmented lesions which were somewhat liquefied in all areas.  The breast nodule measured about 1 cm and the 2 abdominal wall nodules measured approximate 7 mm each.  These were all excised and sent to pathology.  I next anesthetized for areas on her left upper arm with Marcaine.  I made 4 separate  skin incisions and again excised 4 separate 7 mm subcutaneous nodules with the cautery.  Again these were all darkly pigmented and liquefied.  I then anesthetized the skin overlying the palpable mass on the right side of her neck just underneath the angle of the jaw.  I made incision with a scalpel and again excised a 1 cm nodule which was darkly pigmented.  The arm nodules and neck nodule also sent to pathology for evaluation.  I next closed all incisions with 3-0 Vicryl sutures.  I also closed the neck incision with a 4-0 Monocryl suture.  Dermabond was then applied to all incisions.  The patient tolerated the procedure well.  All the counts were correct at the end of the procedure.  The patient was then extubated in the operating room and taken in stable condition to the recovery room.            Coralie Keens   Date: 06/02/2022  Time: 1:52 PM

## 2022-06-02 NOTE — Interval H&P Note (Signed)
History and Physical Interval Note: Oncology now would like as many of the subcutaneous nodules removed as possible given metastatic melanoma is expected at all sights.  After discussing this with [name] further, will remove the right neck nodule, left breast nodule, 4 nodules on the left arm, and 2 on the abdominal wall.  She has at least 15-16 nodules seen on her CT scan but not all are palpable so far.  06/02/2022 11:44 AM  Frances Bright  has presented today for surgery, with the diagnosis of MULTIPLE SUBCUTANEOUS SKIN NODULES, RULE OUT MALIGNANCY.  The various methods of treatment have been discussed with the patient and family. After consideration of risks, benefits and other options for treatment, the patient has consented to  Procedure(s): EXCISION SKIN NODULES LEFT BREAST, ABDOMINAL WALL, AND POSSIBLE RIGHT NECK (N/A) as a surgical intervention.  The patient's history has been reviewed, patient examined, no change in status, stable for surgery.  I have reviewed the patient's chart and labs.  Questions were answered to the patient's satisfaction.     Coralie Keens

## 2022-06-02 NOTE — Anesthesia Procedure Notes (Signed)
Procedure Name: LMA Insertion Date/Time: 06/02/2022 12:56 PM  Performed by: Inda Coke, CRNAPre-anesthesia Checklist: Patient identified, Emergency Drugs available, Suction available, Timeout performed and Patient being monitored Patient Re-evaluated:Patient Re-evaluated prior to induction Oxygen Delivery Method: Circle system utilized Preoxygenation: Pre-oxygenation with 100% oxygen Induction Type: IV induction Ventilation: Mask ventilation without difficulty LMA: LMA inserted LMA Size: 4.0 Tube type: Oral Placement Confirmation: positive ETCO2, CO2 detector and breath sounds checked- equal and bilateral Tube secured with: Tape Dental Injury: Teeth and Oropharynx as per pre-operative assessment

## 2022-06-03 ENCOUNTER — Encounter (HOSPITAL_COMMUNITY): Payer: Self-pay | Admitting: Surgery

## 2022-06-03 LAB — SURGICAL PATHOLOGY

## 2022-06-04 NOTE — Progress Notes (Unsigned)
[name], MD  [name] She has already had a needle biopsy . Thanks  GK        Previous Messages    ----- Message -----  From: [name]  Sent: 06/04/2022  12:42 PM EDT  To: [name], MD  Subject: [place] Core BX                                     Does patient still need this test that was ordered?  [place] Core BX   Thanks  [employer]

## 2022-06-04 NOTE — Anesthesia Postprocedure Evaluation (Signed)
Anesthesia Post Note  Patient: Frances Bright  Procedure(s) Performed: EXCISION SKIN NODULES LEFT BREAST, Left Arm,ABDOMINAL WALL, AND RIGHT NECK     Patient location during evaluation: PACU Anesthesia Type: General Level of consciousness: awake and alert Pain management: pain level controlled Vital Signs Assessment: post-procedure vital signs reviewed and stable Respiratory status: spontaneous breathing, nonlabored ventilation and respiratory function stable Cardiovascular status: blood pressure returned to baseline and stable Postop Assessment: no apparent nausea or vomiting Anesthetic complications: no   No notable events documented.  Last Vitals:  Vitals:   06/02/22 1413 06/02/22 1430  BP: 128/60 128/61  Pulse: 73 67  Resp: 15 16  Temp:  36.7 C  SpO2: 97% 99%    Last Pain:  Vitals:   06/02/22 1430  TempSrc:   PainSc: 2                  Stan Cantave

## 2022-06-05 NOTE — Progress Notes (Signed)
Requested  PD-L1, BRAF mutation test and Foundation 1 NGS on biopsy done on 06/02/22 per Dr Irene Limbo

## 2022-06-09 ENCOUNTER — Telehealth: Payer: Self-pay | Admitting: Hematology

## 2022-06-09 NOTE — Telephone Encounter (Signed)
Scheduled follow-up appointment per 6/16 los. Patient is aware. 

## 2022-06-10 ENCOUNTER — Telehealth: Payer: Medicare HMO | Admitting: Hematology

## 2022-06-11 ENCOUNTER — Ambulatory Visit (HOSPITAL_COMMUNITY)
Admission: RE | Admit: 2022-06-11 | Discharge: 2022-06-11 | Disposition: A | Discharge status: Home / Self Care | Payer: Medicare HMO | Source: Ambulatory Visit | Attending: Hematology | Admitting: Hematology

## 2022-06-11 DIAGNOSIS — C439 Malignant melanoma of skin, unspecified: Secondary | ICD-10-CM | POA: Insufficient documentation

## 2022-06-11 MED ORDER — GADOBUTROL 1 MMOL/ML IV SOLN
4.5000 mL | Freq: Once | INTRAVENOUS | Status: AC | PRN
  Administered 2022-06-11: 4.5 mL via INTRAVENOUS

## 2022-06-12 ENCOUNTER — Ambulatory Visit (HOSPITAL_COMMUNITY): Payer: Medicare HMO

## 2022-06-16 ENCOUNTER — Inpatient Hospital Stay: Payer: Medicare HMO | Attending: Hematology | Admitting: Hematology

## 2022-06-16 ENCOUNTER — Ambulatory Visit (HOSPITAL_COMMUNITY)
Admission: RE | Admit: 2022-06-16 | Discharge: 2022-06-16 | Disposition: A | Discharge status: Home / Self Care | Payer: Medicare HMO | Source: Ambulatory Visit | Attending: Hematology | Admitting: Hematology

## 2022-06-16 DIAGNOSIS — Z7189 Other specified counseling: Secondary | ICD-10-CM

## 2022-06-16 DIAGNOSIS — Z881 Allergy status to other antibiotic agents status: Secondary | ICD-10-CM | POA: Diagnosis not present

## 2022-06-16 DIAGNOSIS — Z79890 Hormone replacement therapy (postmenopausal): Secondary | ICD-10-CM | POA: Diagnosis not present

## 2022-06-16 DIAGNOSIS — G939 Disorder of brain, unspecified: Secondary | ICD-10-CM

## 2022-06-16 DIAGNOSIS — C434 Malignant melanoma of scalp and neck: Secondary | ICD-10-CM | POA: Insufficient documentation

## 2022-06-16 DIAGNOSIS — C439 Malignant melanoma of skin, unspecified: Secondary | ICD-10-CM | POA: Diagnosis not present

## 2022-06-16 DIAGNOSIS — R634 Abnormal weight loss: Secondary | ICD-10-CM | POA: Insufficient documentation

## 2022-06-16 DIAGNOSIS — Z5112 Encounter for antineoplastic immunotherapy: Secondary | ICD-10-CM | POA: Diagnosis not present

## 2022-06-16 DIAGNOSIS — I700 Atherosclerosis of aorta: Secondary | ICD-10-CM | POA: Diagnosis not present

## 2022-06-16 DIAGNOSIS — Z8349 Family history of other endocrine, nutritional and metabolic diseases: Secondary | ICD-10-CM | POA: Diagnosis not present

## 2022-06-16 DIAGNOSIS — Z888 Allergy status to other drugs, medicaments and biological substances status: Secondary | ICD-10-CM | POA: Diagnosis not present

## 2022-06-16 DIAGNOSIS — Z8249 Family history of ischemic heart disease and other diseases of the circulatory system: Secondary | ICD-10-CM | POA: Diagnosis not present

## 2022-06-16 DIAGNOSIS — Z809 Family history of malignant neoplasm, unspecified: Secondary | ICD-10-CM | POA: Diagnosis not present

## 2022-06-16 DIAGNOSIS — Z85820 Personal history of malignant melanoma of skin: Secondary | ICD-10-CM | POA: Insufficient documentation

## 2022-06-16 DIAGNOSIS — R222 Localized swelling, mass and lump, trunk: Secondary | ICD-10-CM | POA: Insufficient documentation

## 2022-06-16 DIAGNOSIS — E039 Hypothyroidism, unspecified: Secondary | ICD-10-CM | POA: Insufficient documentation

## 2022-06-16 DIAGNOSIS — R221 Localized swelling, mass and lump, neck: Secondary | ICD-10-CM | POA: Insufficient documentation

## 2022-06-16 DIAGNOSIS — R2241 Localized swelling, mass and lump, right lower limb: Secondary | ICD-10-CM | POA: Insufficient documentation

## 2022-06-16 DIAGNOSIS — R918 Other nonspecific abnormal finding of lung field: Secondary | ICD-10-CM | POA: Insufficient documentation

## 2022-06-16 DIAGNOSIS — N6320 Unspecified lump in the left breast, unspecified quadrant: Secondary | ICD-10-CM | POA: Diagnosis not present

## 2022-06-16 LAB — GLUCOSE, CAPILLARY: Glucose-Capillary: 112 mg/dL — ABNORMAL HIGH (ref 70–99)

## 2022-06-16 MED ORDER — FLUDEOXYGLUCOSE F - 18 (FDG) INJECTION
5.5000 | Freq: Once | INTRAVENOUS | Status: AC | PRN
  Administered 2022-06-16: 5.1 via INTRAVENOUS

## 2022-06-16 NOTE — Progress Notes (Signed)
Marland Kitchen   HEMATOLOGY/ONCOLOGY PHONE VISIT NOTE  Date of Service: 06/16/2022   Patient Care Team: Midge Minium, MD as PCP - General (Family Medicine) Monna Fam, MD as Consulting Physician (Ophthalmology) Lady Gary, [facility]  CHIEF COMPLAINTS/PURPOSE OF CONSULTATION:  Follow-up for newly diagnosed metastatic melanoma  HISTORY OF PRESENTING ILLNESS:   Frances Bright is a wonderful 66 y.o. female who has been referred to [place] by Dr .Birdie Riddle, Aundra Millet, MD and Dr. Dalbert Batman from functional medicine for evaluation and management of possible lymphoma.  Patient has a history of GERD, anxiety, arthritis, hypothyroidism, irritable bowel syndrome, melanoma of the back resected in 2001 who has been following up with functional medicine Dr. Dalbert Batman. Patient notes in the last 1 month or so she has developed right neck swelling as well as multiple nodules over her bilateral arms chest upper abdomen and groin.  These are itchy and some of which are bluish-green and appeared to be centered around possible blood vessels.   Patient associates initiation of these considerations since she has been on nutritional supplement called Biocidin from Jan 2023 until April 2023. She also notes having received multiple IV supplements from her functional medicine physician's office.  She could not tell me exactly what these involved.  She notes these were for improving her energy and for detox.  Patient notes weight loss 10lbs over 9-10 months unintentional Chills + 1-2 weeks No fevers No tick bites no fleas. No farm animals contact.  Dog at home. Off HRT for a few years. On thyroid replacement - Armor thyroid. (Prior was on naturethyroid about 3 yrs ago).  No other new medications. Patient notes no significant new fatigue or change in appetite. 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  She was  empirically started on doxycycline by Dr. Dalbert Batman.  She was referred to [place] to rule out possible lymphoma.  INTERVAL HISTORY I connected with Frances Bright on 06/16/2022 at 3:30 PM EST by telephone visit and verified that I am speaking with the correct person using two identifiers.   I discussed the limitations, risks, security and privacy concerns of performing an evaluation and management service by telemedicine and the availability of in-person appointments. I also discussed with the patient that there may be a patient responsible charge related to this service. The patient expressed understanding and agreed to proceed.   Other persons participating in the visit and their role in the encounter: None  Patient's location: Home Provider's location: [facility]  Frances Bright is a 66 y.o. female who was contacted via phone for follow-up to discuss the results of her FNA that shows newly diagnosed metastatic melanoma.She reports She is doing well with no new symptoms or concerns.  We discussed that based on MRI brain w w/o contrast done 06/11/2022 that she is advised to meet with Dr. Mickeal Skinner MD for further evaluation of the spot found in the brain which she is agreeable to. We will put in a referral.  We discussed that given her current diagnosis and treatment plan that she is advised to avoid steroids at this time.  No other acute new focal symptoms. No headaches change in vision or focal neurological deficits. No new chest pain or shortness of breath. No fevers no chills no night sweats no unexpected weight loss.  We discussed labs done 06/02/2022 in detail.  We discussed surgical pathology done 06/02/2022.  We discussed PET/CT whole body done today.  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 Dr Sharol Roussel   Anxiety    Arthritis    Atrophic vaginitis     Diverticulosis 02/2008   Enteritis, candida 2011   GERD (gastroesophageal reflux disease)    Gluten intolerance 2012   H/O bilateral breast implants    Hypothyroidism    Melanoma ([place]) 2001   back of neck   PMB (postmenopausal bleeding) 09/2012   On HRT - PUS/ SHGM with fibroids   Sleep apnea   Irritable bowel syndrome Melanoma back of neck - 2001 -- dermatologist every year -- [facility] Dr [place].  SURGICAL HISTORY: Past Surgical History:  Procedure Laterality Date   BREAST ENHANCEMENT SURGERY Bilateral 30 years ago   silicone, replaced with saline 6 years ago   [place]     over 23yr ago for abnormal pap smear   MASS EXCISION N/A 06/02/2022   Procedure: EXCISION SKIN NODULES LEFT BREAST, Left Arm,ABDOMINAL WALL, AND RIGHT NECK;  Surgeon: BCoralie Keens MD;  [facility];  Service: General;  Laterality: N/A;   MELANOMA EXCISION  2001   back of neck   MYOMECTOMY  about 1987   for fibroids   UMBILICAL HERNIA REPAIR N/A 07/31/2021   Procedure: UMBILICAL HERNIA REPAIR;  Surgeon: BCoralie Keens MD;  [facility]  Service: General;  Laterality: N/A;  LMA    SOCIAL HISTORY: Social History   Socioeconomic History   Marital status: Married    Spouse name: Not on file   Number of children: 0   Years of education: Not on file   Highest education level: Not on file  Occupational History   Not on file  Tobacco Use   Smoking status: Never   Smokeless tobacco: Never  Vaping Use   Vaping Use: Never used  Substance and Sexual Activity   Alcohol use: No   Drug use: No   Sexual activity: Not Currently    Partners: Male    Birth control/protection: Post-menopausal  Other Topics Concern   Not on file  Social History Narrative   Not on file   Social Determinants of Health   Financial Resource Strain: Not on file  Food Insecurity: Not on file  Transportation Needs: Not on file  Physical Activity: Not  on file  Stress: Not on file  Social Connections: Not on file  Intimate Partner Violence: Not on file  No cigarettes Marijuana in the past ETOH social use  FAMILY HISTORY: Family History  Problem Relation Age of Onset   Hypertension Mother    Hyperlipidemia Mother    Cancer Father   Father-- brain cancer   ALLERGIES:  is allergic to prednisone and cephalexin. Milk is diarrhea Amitriptyline-excessive somnolence Gluten Testosterone HRT cream-  MEDICATIONS:  Current Outpatient Medications  Medication Sig Dispense Refill   levothyroxine (SYNTHROID) 50 MCG tablet Take 1 tablet (50 mcg total) by mouth daily before breakfast. 90 tablet 0   liothyronine (CYTOMEL) 5 MCG tablet Take 2 tablets (10 mcg total) by mouth daily before breakfast. 180 tablet 0   Propylene Glycol (SYSTANE BALANCE) 0.6 % SOLN Place 1 drop into both eyes in the morning and at bedtime.     traMADol (ULTRAM) 50 MG tablet Take 1 tablet (50 mg total) by  mouth every 6 (six) hours as needed for moderate pain or severe pain. 25 tablet 0   No current facility-administered medications for this visit.    REVIEW OF SYSTEMS:   10 Point review of Systems was done is negative except as noted above.  PHYSICAL EXAMINATION: Telemedicine appointment  LABORATORY DATA:  I have reviewed the data as listed  .    Latest Ref Rng & Units 06/02/2022   10:57 AM 04/18/2022    2:35 PM 11/18/2021   11:52 AM  CBC  WBC 4.0 - 10.5 K/uL 3.5  3.7  3.8   Hemoglobin 12.0 - 15.0 g/dL 12.8  12.8  13.3   Hematocrit 36.0 - 46.0 % 37.4  38.8  39.4   Platelets 150 - 400 K/uL 177  216  232.0     .    Latest Ref Rng & Units 06/02/2022   10:57 AM 04/18/2022    2:35 PM 11/18/2021   11:52 AM  CMP  Glucose 70 - 99 mg/dL 99  93  91   BUN 8 - 23 mg/dL '20  22  14   '$ Creatinine 0.44 - 1.00 mg/dL 0.78  0.74  0.73   Sodium 135 - 145 mmol/L 139  138  140   Potassium 3.5 - 5.1 mmol/L 3.8  4.0  4.2   Chloride 98 - 111 mmol/L 102  104  101   CO2 22 -  32 mmol/L '25  28  31   '$ Calcium 8.9 - 10.3 mg/dL 9.0  9.2  9.5   Total Protein 6.5 - 8.1 g/dL  7.1  6.9   Total Bilirubin 0.3 - 1.2 mg/dL  1.2  1.1   Alkaline Phos 38 - 126 U/L  75  76   AST 15 - 41 U/L  22  14   ALT 0 - 44 U/L  22  13    . Lab Results  Component Value Date   LDH 166 04/18/2022   CYTOLOGY - NON PAP  CASE: [id number]  PATIENT: Burley Saver  Non-Gynecological Cytology Report      Clinical History: Melanoma of the neck 20 years ago, multiple new small  sub Q nodules  Specimen Submitted:  A. SUBCUTANEOUS NODULE, LEFT LATERAL THIGH, FINE  NEEDLE ASPIRATION:    FINAL MICROSCOPIC DIAGNOSIS:  - Malignant cells present  - Consistent with metastatic melanoma   RADIOGRAPHIC STUDIES: I have personally reviewed the radiological images as listed and agreed with the findings in the report. NM PET Image Initial (PI) Whole Body  Result Date: 06/16/2022 CLINICAL DATA:  Initial treatment strategy for melanoma. EXAM: NUCLEAR MEDICINE PET WHOLE BODY TECHNIQUE: 5.1 mCi F-18 FDG was injected intravenously. Full-ring PET imaging was performed from the head to foot after the radiotracer. CT data was obtained and used for attenuation correction and anatomic localization. Fasting blood glucose: 112 mg/dl COMPARISON:  CT 05/07/2022 FINDINGS: Mediastinal blood pool activity: SUV max 1.57 HEAD/NECK: There is mild increased FDG uptake localizing to the subcutaneous nodule in the upper right neck identified on CT from 05/07/2022. This measures 7 mm within SUV max of 2.98, image 57/4. Incidental CT findings: none CHEST: No tracer avid axillary, supraclavicular, mediastinal, or hilar lymph nodes. 5 mm left upper lobe lung nodule is again noted, image 12/7. This is technically too small to reliably characterize. SUV max is equal to 0.78. Stable subpleural nodule within the lateral left lung base measures 4 mm, image 62/7. Also too small to reliably characterize. Incidental CT findings: Aortic  atherosclerosis. No pericardial effusion. ABDOMEN/PELVIS: No abnormal hypermetabolic activity within the liver, pancreas, adrenal glands, or spleen. No hypermetabolic lymph nodes in the abdomen or pelvis. Incidental CT findings: Aortic atherosclerotic calcifications. SKELETON: No focal hypermetabolic activity to suggest skeletal metastasis. Multifocal subcutaneous soft tissue nodules are again noted and exhibit mild tracer uptake. Sample nodules include: Tracer avid nodule along the medial aspect of the left breast has an SUV max of 2.17, image 131/4. Index nodule within the right upper quadrant ventral abdominal wall measures 6 mm with SUV max of 1.89, image 144/4. Within the subcutaneous soft tissues of the right lower posterior abdominal wall nodule measures 5 mm with SUV max of 1.42, image 165/4. Incidental CT findings: none EXTREMITIES: Index nodule within the left upper extremity measures 9 mm with SUV max of 3.09, image 88/4. More distally within the left upper extremity is a subcutaneous nodule measuring 1.1 cm within SUV max of 2.31, image 122/4. Within the lateral aspect of the left thigh there is a small subcutaneous nodule measuring 4 mm with SUV max of 1.37, image 227/4. Soft tissue nodule within the anteromedial right thigh measures 8 mm with SUV max of 2.40, image 223/4. Incidental CT findings: none IMPRESSION: 1. Corresponding to the CT findings there are multiple subcutaneous nodules within the neck, chest, abdomen, left upper extremity, and proximal bilateral lower extremities. These nodules exhibit mild tracer uptake and may reflect sites of metastatic melanoma 2. No tracer avid solid organ or nodal metastasis is identified. 3. There are 2 small left lung nodules which are unchanged from 05/07/2022. These are technically too small to reliably characterize by PET-CT. Attention on future surveillance imaging is advised. Electronically Signed   By: Kerby Moors M.D.   On: 06/16/2022 12:34   MR  Brain W Wo Contrast  Result Date: 06/12/2022 CLINICAL DATA:  Provided history: Metastatic melanoma. Skin cancer, staging; initial staging for newly diagnosed metastatic melanoma. EXAM: MRI HEAD WITHOUT AND WITH CONTRAST TECHNIQUE: Multiplanar, multiecho pulse sequences of the brain and surrounding structures were obtained without and with intravenous contrast. CONTRAST:  4.73m GADAVIST GADOBUTROL 1 MMOL/ML IV SOLN COMPARISON:  Neck CT 05/07/2022. FINDINGS: Brain: No age advanced or lobar predominant parenchymal atrophy. 3 mm focus of susceptibility-weighted signal loss and subtle enhancement within the right basal ganglia (series 9, image 27) (series 16, images 78 and 79) (series 17, image 20). Multifocal T2 FLAIR hyperintense signal abnormality within the cerebral white matter, nonspecific but compatible with mild chronic small vessel ischemic disease. There is no acute infarct. No extra-axial fluid collection. No midline shift. Vascular: Maintained flow voids within the proximal large arterial vessels. Skull and upper cervical spine: No focal suspicious marrow lesion. Sinuses/Orbits: No mass or acute finding within the imaged orbits. Trace mucosal thickening within the bilateral ethmoid and left maxillary sinuses. Impression #1 will be called to the ordering clinician or representative by the Radiologist Assistant, and communication documented in the PACS or [employer] IMPRESSION: 3 mm focus of susceptibility-weighted signal loss and subtle enhancement within the right basal ganglia without appreciable surrounding edema. 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 Short-interval brain MRI follow-up without and with contrast is recommended for surveillance of this finding. Mild chronic small vessel image changes within the cerebral white matter. Electronically Signed   By: KKellie SimmeringD.O.   On: 06/12/2022 07:47   [place] NEED[place] ASP 1ST LESION  Result Date: 05/26/2022 INDICATION:  66year old female with remote history of cutaneous melanoma 20 years previously.  Now she has numerous small superficial subcutaneous nodules with a clinical concern for metastatic melanoma. EXAM: Ultrasound-guided fine-needle aspiration biopsy. MEDICATIONS: None. ANESTHESIA/SEDATION: None. COMPLICATIONS: None immediate. PROCEDURE: Informed written consent was obtained from the patient after a thorough discussion of the procedural risks, benefits and alternatives. All questions were addressed. Maximal Sterile Barrier Technique was utilized including caps, mask, sterile gowns, sterile gloves, sterile drape, hand hygiene and skin antiseptic. A timeout was performed prior to the initiation of the procedure. Ultrasound was used to interrogate multiple of the subcutaneous nodules to identified the ideal target site. Nodules just inferior to the left breast, along the right anterior abdominal wall, the left lateral upper arm, in the left lateral mid thigh were all evaluated. All nodules are very small in size and extremely superficial in the subcutaneous fat layer. Additionally, the nodules are quite mobile. The thigh lesion was slightly deeper and less mobile. Therefore, it was targeted for biopsy. Given the very small size of the nodule, core biopsy is likely not attainable. Therefore, we will proceed with ultrasound-guided fine-needle aspiration biopsy. A suitable skin entry site was selected and marked. The region was sterilely prepped and draped in the standard fashion with chlorhexidine skin prep. Local anesthesia was attained by infiltration with 1% lidocaine. Under real-time ultrasound guidance, multiple 25 gauge fine-needle aspirate biopsies were obtained. Biopsy specimens were taken to pathology. A quick stain was performed. I received confirmation of adequate tissue sampling. No further samples were then obtained. IMPRESSION: Technically successful ultrasound-guided fine-needle aspiration biopsy of superficial  subcutaneous nodule in the lateral aspect of the left mid thigh. Electronically Signed   By: Jacqulynn Cadet M.D.   On: 05/26/2022 15:46    ASSESSMENT & PLAN:  66 year old female with  #1  Newly diagnosed metastatic melanoma presenting with right upper neck mass and numerous skin nodules over upper extremities trunk and upper proximal thigh. 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 Plan -Labs done 06/02/2022 were reviewed  in detail.  CBC WNL except reduced WBC count of 3.5k. CMP unremarkable.  -Surgical pathology done 06/02/2022 revealed several soft tissue masses consistent with metastatic melanoma. -awaiting molecular NGS results. Her metastases appear numerous and widespread. -We discussed MRI brain w w/o contrast done 06/11/2022 revealed 3 mm focus and subtle enhancement in right basal ganglia without surrounding edema. indeterminate for possible metastasis. -We discussed PET/CT whole body done today revealed multiple subcutaneous nodules in neck, chest, abdomen, left upper extremity, and proximal b/l lower extremities with mild tracer uptake consistent with possible metastasis of melanoma. 2 small left lung nodules unchanged that are too small to characterize.  -We discussed that given her current diagnosis and treatment plan that she is advised to avoid steroids at this time. -We discussed immunotherapy treatment Ipi1/Nivo 3 regimen assuming there are no brain metastases that take treatment priority. -Refer to Dr. Mickeal Skinner MD for further evaluation of the spot found in the brain. We discussed that she might need short interval reimaging to monitor this brain lesion.  No orders of the defined types were placed in this encounter.  Follow-up -Referral to Dr Mickeal Skinner for evaluation of indeterminate brain lesion in the context of new diagnosis of  metastatic melanoma. -Chemo-education for Ipilimumab/Nivolumab immunotherapy -start IPI/NIVO immunotherapy in 1-2 weeks with labs -MD visit in 2 weeks after C1 of IPI/NIVO for toxicity check  .The total time spent in the appointment  was 32 minutes* .  All of the patient's questions were answered with apparent satisfaction. The patient knows to call the clinic with any problems, questions or concerns.   Sullivan Lone MD MS AAHIVMS [facility] Hematology/Oncology Physician [facility]  .*Total Encounter Time as defined by the Centers for Medicare and Medicaid Services includes, in addition to the face-to-face time of a patient visit (documented in the note above) non-face-to-face time: obtaining and reviewing outside history, ordering and reviewing medications, tests or procedures, care coordination (communications with other health care professionals or caregivers) and documentation in the medical record.  I, Melene Muller, am acting as scribe for Dr. Sullivan Lone, MD.  .I have reviewed the above documentation for accuracy and completeness, and I agree with the above. [name] MD

## 2022-06-18 DIAGNOSIS — C439 Malignant melanoma of skin, unspecified: Secondary | ICD-10-CM | POA: Diagnosis not present

## 2022-06-19 ENCOUNTER — Other Ambulatory Visit: Payer: Self-pay

## 2022-06-19 ENCOUNTER — Encounter (HOSPITAL_COMMUNITY): Payer: Self-pay

## 2022-06-20 ENCOUNTER — Encounter (HOSPITAL_COMMUNITY): Payer: Self-pay

## 2022-06-22 ENCOUNTER — Encounter: Payer: Self-pay | Admitting: Hematology

## 2022-06-22 DIAGNOSIS — Z7189 Other specified counseling: Secondary | ICD-10-CM | POA: Insufficient documentation

## 2022-06-22 DIAGNOSIS — C439 Malignant melanoma of skin, unspecified: Secondary | ICD-10-CM | POA: Insufficient documentation

## 2022-06-22 MED ORDER — ONDANSETRON HCL 8 MG PO TABS: 8.0000 mg | ORAL_TABLET | Freq: Three times a day (TID) | ORAL | 0 refills | Status: DC | PRN

## 2022-06-22 NOTE — Progress Notes (Signed)
START ON PATHWAY REGIMEN - Melanoma and Other Skin Cancers     Cycles 1 through 4: A cycle is every 21 days:     Nivolumab      Ipilimumab    Cycles 5 and beyond: A cycle is every 28 days:     Nivolumab   **Always confirm dose/schedule in your pharmacy ordering system**  Patient Characteristics: Melanoma, Cutaneous/Unknown Primary, Distant Metastases, Unresectable, No Brain Metastases, First Line, BRAF V600 Wild Type / BRAF V600 Results Pending or Unknown, Candidate for Immunotherapy Disease Classification: Melanoma Disease Subtype: Cutaneous BRAF V600 Mutation Status: Awaiting BRAF V600 Results Therapeutic Status: Distant Metastases Line of Therapy: First Line Immunotherapy Candidate Status: Candidate for Immunotherapy Intent of Therapy: Non-Curative / Palliative Intent, Discussed with Patient

## 2022-06-23 ENCOUNTER — Telehealth: Payer: Self-pay | Admitting: Hematology

## 2022-06-23 NOTE — Telephone Encounter (Signed)
.  Called patient to schedule appointment per 7/9 inbasket, patient is aware of date and time.

## 2022-06-26 DIAGNOSIS — Z0100 Encounter for examination of eyes and vision without abnormal findings: Secondary | ICD-10-CM | POA: Diagnosis not present

## 2022-06-30 ENCOUNTER — Inpatient Hospital Stay: Payer: Medicare HMO

## 2022-06-30 ENCOUNTER — Other Ambulatory Visit: Payer: Self-pay

## 2022-06-30 ENCOUNTER — Encounter: Payer: Self-pay | Admitting: Internal Medicine

## 2022-06-30 ENCOUNTER — Inpatient Hospital Stay: Payer: Medicare HMO | Admitting: Internal Medicine

## 2022-06-30 VITALS — BP 119/61 | HR 84 | Temp 97.7°F | Resp 15 | Ht 66.0 in | Wt 105.3 lb

## 2022-06-30 DIAGNOSIS — N6320 Unspecified lump in the left breast, unspecified quadrant: Secondary | ICD-10-CM | POA: Diagnosis not present

## 2022-06-30 DIAGNOSIS — R221 Localized swelling, mass and lump, neck: Secondary | ICD-10-CM | POA: Diagnosis not present

## 2022-06-30 DIAGNOSIS — Z85820 Personal history of malignant melanoma of skin: Secondary | ICD-10-CM | POA: Diagnosis not present

## 2022-06-30 DIAGNOSIS — C439 Malignant melanoma of skin, unspecified: Secondary | ICD-10-CM | POA: Diagnosis not present

## 2022-06-30 DIAGNOSIS — R634 Abnormal weight loss: Secondary | ICD-10-CM | POA: Diagnosis not present

## 2022-06-30 DIAGNOSIS — Z809 Family history of malignant neoplasm, unspecified: Secondary | ICD-10-CM | POA: Diagnosis not present

## 2022-06-30 DIAGNOSIS — I700 Atherosclerosis of aorta: Secondary | ICD-10-CM | POA: Diagnosis not present

## 2022-06-30 DIAGNOSIS — Z888 Allergy status to other drugs, medicaments and biological substances status: Secondary | ICD-10-CM | POA: Diagnosis not present

## 2022-06-30 DIAGNOSIS — C434 Malignant melanoma of scalp and neck: Secondary | ICD-10-CM | POA: Diagnosis not present

## 2022-06-30 DIAGNOSIS — R222 Localized swelling, mass and lump, trunk: Secondary | ICD-10-CM | POA: Diagnosis not present

## 2022-06-30 DIAGNOSIS — Z8249 Family history of ischemic heart disease and other diseases of the circulatory system: Secondary | ICD-10-CM | POA: Diagnosis not present

## 2022-06-30 DIAGNOSIS — E039 Hypothyroidism, unspecified: Secondary | ICD-10-CM | POA: Diagnosis not present

## 2022-06-30 DIAGNOSIS — Z881 Allergy status to other antibiotic agents status: Secondary | ICD-10-CM | POA: Diagnosis not present

## 2022-06-30 DIAGNOSIS — R918 Other nonspecific abnormal finding of lung field: Secondary | ICD-10-CM | POA: Diagnosis not present

## 2022-06-30 DIAGNOSIS — Z8349 Family history of other endocrine, nutritional and metabolic diseases: Secondary | ICD-10-CM | POA: Diagnosis not present

## 2022-06-30 DIAGNOSIS — Z79890 Hormone replacement therapy (postmenopausal): Secondary | ICD-10-CM | POA: Diagnosis not present

## 2022-06-30 DIAGNOSIS — Z5112 Encounter for antineoplastic immunotherapy: Secondary | ICD-10-CM | POA: Diagnosis not present

## 2022-06-30 DIAGNOSIS — R2241 Localized swelling, mass and lump, right lower limb: Secondary | ICD-10-CM | POA: Diagnosis not present

## 2022-06-30 NOTE — Progress Notes (Signed)
[facility] [place], [place] [address] [phone]   New Patient Evaluation  Date of Service: 06/30/22 Patient Name: Frances Bright Patient MRN: [id number] Patient DOB: [birth year] Provider: Ventura Sellers, MD  Identifying Statement:  Frances Bright is a 66 y.o. female with Metastatic melanoma [facility]) - Plan: MR BRAIN W WO CONTRAST, CANCELED: MR BRAIN W [place] who presents for initial consultation and evaluation regarding cancer associated neurologic deficits.    Referring Provider: [name], MD [address] [place],  [place] [address]  Primary Cancer:  Oncologic History: Oncology History  Metastatic melanoma ([place])  06/22/2022 Initial Diagnosis   Metastatic melanoma ([place])   06/24/2022 -  Chemotherapy   Patient is on Treatment Plan : MELANOMA Nivolumab + Ipilimumab q21d / Nivolumab q28d       History of Present Illness: The patient's records from the referring physician were obtained and reviewed and the patient interviewed to confirm this HPI.  Frances Bright presents to review recent abnormal MRI scan.  She describes no neurologic complaints.  Plans to undergo first treatment with ipi/nivo next week with Dr. Irene Limbo.  Maintains functional independence, denies headaches, seizures.  Medications: Current Outpatient Medications on File Prior to Visit  Medication Sig Dispense Refill   levothyroxine (SYNTHROID) 50 MCG tablet Take 1 tablet (50 mcg total) by mouth daily before breakfast. 90 tablet 0   liothyronine (CYTOMEL) 5 MCG tablet Take 2 tablets (10 mcg total) by mouth daily before breakfast. 180 tablet 0   Propylene Glycol (SYSTANE BALANCE) 0.6 % SOLN Place 1 drop into both eyes in the morning and at bedtime.     ondansetron (ZOFRAN) 8 MG tablet Take 1 tablet (8 mg total) by mouth every 8 (eight) hours as needed for nausea or vomiting. (Patient not taking: Reported on 06/30/2022) 20 tablet 0   No  current facility-administered medications on file prior to visit.    Allergies:  Allergies  Allergen Reactions   Prednisone Swelling and Other (See Comments)    Patient noted leg swelling and severe headache with initiation of prednisone. Dr. Irene Limbo aware. Pt noted resolution of s/s after d/c medication.   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 Dr Sharol Roussel   Anxiety    Arthritis    Atrophic vaginitis    Diverticulosis 02/2008   Enteritis, candida 2011   GERD (gastroesophageal reflux disease)    Gluten intolerance 2012   H/O bilateral breast implants    Hypothyroidism    Melanoma ([place]) 2001   back of neck   PMB (postmenopausal bleeding) 09/2012   On HRT - PUS/ SHGM with fibroids   Sleep apnea    Past Surgical History:  Past Surgical History:  Procedure Laterality Date   BREAST ENHANCEMENT SURGERY Bilateral 30 years ago   silicone, replaced with saline 6 years ago   [place]     over 85yr ago for abnormal pap smear   MASS EXCISION N/A 06/02/2022   Procedure: EXCISION SKIN NODULES LEFT BREAST, Left Arm,ABDOMINAL WALL, AND RIGHT NECK;  Surgeon: BCoralie Keens MD;  [facility]  Service: General;  Laterality: N/A;   MELANOMA EXCISION  2001   back of neck   MYOMECTOMY  about 1987  for fibroids   UMBILICAL HERNIA REPAIR N/A 07/31/2021   Procedure: UMBILICAL HERNIA REPAIR;  Surgeon: Coralie Keens, MD;  Location: [facility];  Service: General;  Laterality: N/A;  LMA   Social History:  Social History   Socioeconomic History   Marital status: Married    Spouse name: Not on file   Number of children: 0   Years of education: Not on file   Highest education level: Not on file  Occupational History   Not on file  Tobacco Use   Smoking status: Never   Smokeless tobacco: Never  Vaping  Use   Vaping Use: Never used  Substance and Sexual Activity   Alcohol use: No   Drug use: No   Sexual activity: Not Currently    Partners: Male    Birth control/protection: Post-menopausal  Other Topics Concern   Not on file  Social History Narrative   Not on file   Social Determinants of Health   Financial Resource Strain: Not on file  Food Insecurity: Not on file  Transportation Needs: Not on file  Physical Activity: Not on file  Stress: Not on file  Social Connections: Not on file  Intimate Partner Violence: Not on file   Family History:  Family History  Problem Relation Age of Onset   Hypertension Mother    Hyperlipidemia Mother    Cancer Father     Review of Systems: Constitutional: Doesn't report fevers, chills or abnormal weight loss Eyes: Doesn't report blurriness of vision Ears, nose, mouth, throat, and face: Doesn't report sore throat Respiratory: Doesn't report cough, dyspnea or wheezes Cardiovascular: Doesn't report palpitation, chest discomfort  Gastrointestinal:  Doesn't report nausea, constipation, diarrhea GU: Doesn't report incontinence Skin: Doesn't report skin rashes Neurological: Per HPI Musculoskeletal: Doesn't report joint pain Behavioral/Psych: Doesn't report anxiety  Physical Exam: Vitals:   06/30/22 1413  BP: 119/61  Pulse: 84  Resp: 15  Temp: 97.7 F (36.5 C)  SpO2: 98%   KPS: 90. General: Alert, cooperative, pleasant, in no acute distress Head: Normal EENT: No conjunctival injection or scleral icterus.  Lungs: Resp effort normal Cardiac: Regular rate Abdomen: Non-distended abdomen Skin: No rashes cyanosis or petechiae. Extremities: No clubbing or edema  Neurologic Exam: Mental Status: Awake, alert, attentive to examiner. Oriented to self and environment. Language is fluent with intact comprehension.  Cranial Nerves: Visual acuity is grossly normal. Extra-ocular movements intact. No ptosis. Face is symmetric Motor: Tone and  bulk are normal. Power is full in both arms and legs.  Sensory: Intact Gait: Normal.   Labs: I have reviewed the data as listed    Component Value Date/Time   NA 139 06/02/2022 1057   K 3.8 06/02/2022 1057   CL 102 06/02/2022 1057   CO2 25 06/02/2022 1057   GLUCOSE 99 06/02/2022 1057   BUN 20 06/02/2022 1057   CREATININE 0.78 06/02/2022 1057   CREATININE 0.74 04/18/2022 1435   CALCIUM 9.0 06/02/2022 1057   PROT 7.1 04/18/2022 1435   ALBUMIN 4.6 04/18/2022 1435   AST 22 04/18/2022 1435   ALT 22 04/18/2022 1435   ALKPHOS 75 04/18/2022 1435   BILITOT 1.2 04/18/2022 1435   GFRNONAA >60 06/02/2022 1057   GFRNONAA >60 04/18/2022 1435   GFRAA 113 01/12/2009 0954   Lab Results  Component Value Date   WBC 3.5 (L) 06/02/2022   NEUTROABS 2.1 04/18/2022   HGB 12.8 06/02/2022   HCT 37.4 06/02/2022   MCV 87.4 06/02/2022   PLT 177  06/02/2022    Imaging:  NM PET Image Initial (PI) Whole Body  Result Date: 06/16/2022 CLINICAL DATA:  Initial treatment strategy for melanoma. EXAM: NUCLEAR MEDICINE PET WHOLE BODY TECHNIQUE: 5.1 mCi F-18 FDG was injected intravenously. Full-ring PET imaging was performed from the head to foot after the radiotracer. CT data was obtained and used for attenuation correction and anatomic localization. Fasting blood glucose: 112 mg/dl COMPARISON:  CT 05/07/2022 FINDINGS: Mediastinal blood pool activity: SUV max 1.57 HEAD/NECK: There is mild increased FDG uptake localizing to the subcutaneous nodule in the upper right neck identified on CT from 05/07/2022. This measures 7 mm within SUV max of 2.98, image 57/4. Incidental CT findings: none CHEST: No tracer avid axillary, supraclavicular, mediastinal, or hilar lymph nodes. 5 mm left upper lobe lung nodule is again noted, image 12/7. This is technically too small to reliably characterize. SUV max is equal to 0.78. Stable subpleural nodule within the lateral left lung base measures 4 mm, image 62/7. Also too small to  reliably characterize. Incidental CT findings: Aortic atherosclerosis. No pericardial effusion. ABDOMEN/PELVIS: No abnormal hypermetabolic activity within the liver, pancreas, adrenal glands, or spleen. No hypermetabolic lymph nodes in the abdomen or pelvis. Incidental CT findings: Aortic atherosclerotic calcifications. SKELETON: No focal hypermetabolic activity to suggest skeletal metastasis. Multifocal subcutaneous soft tissue nodules are again noted and exhibit mild tracer uptake. Sample nodules include: Tracer avid nodule along the medial aspect of the left breast has an SUV max of 2.17, image 131/4. Index nodule within the right upper quadrant ventral abdominal wall measures 6 mm with SUV max of 1.89, image 144/4. Within the subcutaneous soft tissues of the right lower posterior abdominal wall nodule measures 5 mm with SUV max of 1.42, image 165/4. Incidental CT findings: none EXTREMITIES: Index nodule within the left upper extremity measures 9 mm with SUV max of 3.09, image 88/4. More distally within the left upper extremity is a subcutaneous nodule measuring 1.1 cm within SUV max of 2.31, image 122/4. Within the lateral aspect of the left thigh there is a small subcutaneous nodule measuring 4 mm with SUV max of 1.37, image 227/4. Soft tissue nodule within the anteromedial right thigh measures 8 mm with SUV max of 2.40, image 223/4. Incidental CT findings: none IMPRESSION: 1. Corresponding to the CT findings there are multiple subcutaneous nodules within the neck, chest, abdomen, left upper extremity, and proximal bilateral lower extremities. These nodules exhibit mild tracer uptake and may reflect sites of metastatic melanoma 2. No tracer avid solid organ or nodal metastasis is identified. 3. There are 2 small left lung nodules which are unchanged from 05/07/2022. These are technically too small to reliably characterize by PET-CT. Attention on future surveillance imaging is advised. Electronically Signed    By: Kerby Moors M.D.   On: 06/16/2022 12:34   MR Brain W Wo Contrast  Result Date: 06/12/2022 CLINICAL DATA:  Provided history: Metastatic melanoma. Skin cancer, staging; initial staging for newly diagnosed metastatic melanoma. EXAM: MRI HEAD WITHOUT AND WITH CONTRAST TECHNIQUE: Multiplanar, multiecho pulse sequences of the brain and surrounding structures were obtained without and with intravenous contrast. CONTRAST:  4.58m GADAVIST GADOBUTROL 1 MMOL/ML IV SOLN COMPARISON:  Neck CT 05/07/2022. FINDINGS: Brain: No age advanced or lobar predominant parenchymal atrophy. 3 mm focus of susceptibility-weighted signal loss and subtle enhancement within the right basal ganglia (series 9, image 27) (series 16, images 78 and 79) (series 17, image 20). Multifocal T2 FLAIR hyperintense signal abnormality within the cerebral white matter, nonspecific  but compatible with mild chronic small vessel ischemic disease. There is no acute infarct. No extra-axial fluid collection. No midline shift. Vascular: Maintained flow voids within the proximal large arterial vessels. Skull and upper cervical spine: No focal suspicious marrow lesion. Sinuses/Orbits: No mass or acute finding within the imaged orbits. Trace mucosal thickening within the bilateral ethmoid and left maxillary sinuses. Impression #1 will be called to the ordering clinician or representative by the Radiologist Assistant, and communication documented in the PACS or [employer]. IMPRESSION: 3 mm focus of susceptibility-weighted signal loss and subtle enhancement within the right basal ganglia without appreciable surrounding edema. 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 Short-interval brain MRI follow-up without and with contrast is recommended for surveillance of this finding. Mild chronic small vessel image changes within the cerebral white matter. Electronically Signed   By: Kellie Simmering D.O.   On: 06/12/2022 07:47    [place]  Clinician Interpretation: I have personally reviewed the radiological images as listed.  My interpretation, in the context of the patient's clinical presentation, is  metastasis vs cavernoma   Assessment/Plan Metastatic melanoma ([place]) - Plan: MR BRAIN W WO CONTRAST, CANCELED: MR BRAIN W WO CONTRAST  Frances Bright presents with clinical and radiographic syndrome localizing to the right anterior limb of the internal capsule.  Etiology is either metastatic melanoma or incidental finding such as cavernoma.    We recommended repeating MRI brain in 1 month with 3T SRS protocol.  If lesion progresses, patient agreeable to proceed with radiosurgery.    We spent twenty additional minutes teaching regarding the natural history, biology, and historical experience in the treatment of neurologic complications of cancer.   We appreciate the opportunity to participate in the care of Frances Bright.   We ask that Frances Bright return to clinic in 1 months following next brain MRI, or sooner as needed.  All questions were answered. The patient knows to call the clinic with any problems, questions or concerns. No barriers to learning were detected.  The total time spent in the encounter was 40 minutes and more than 50% was on counseling and review of test results   Ventura Sellers, MD Medical Director of Neuro-Oncology [facility] at [place] 06/30/22 3:18 PM

## 2022-07-01 ENCOUNTER — Telehealth: Payer: Self-pay | Admitting: Internal Medicine

## 2022-07-01 NOTE — Telephone Encounter (Signed)
Per 7/17 los called and left message for pt about appointment  Details and call back number were left

## 2022-07-02 ENCOUNTER — Telehealth: Payer: Self-pay | Admitting: Family Medicine

## 2022-07-02 NOTE — Telephone Encounter (Signed)
Left pt a vm explaining she will need to call the office to make an apt with Dr Birdie Riddle

## 2022-07-02 NOTE — Telephone Encounter (Signed)
Caller name: Audrielle (pt)  On DPR? :yes/no: Yes  Call back number: [phone]  Provider they see: Dr. Birdie Riddle  Reason for call: Pt would like to have TSH levels checked. Was told that thyroid levels my be running high by another provider [place] of [place]. Is she ok to come in for labs anytime? Please advise.

## 2022-07-02 NOTE — Telephone Encounter (Signed)
Pt would need appt to discuss any possible sxs and where/when labs were done

## 2022-07-03 ENCOUNTER — Other Ambulatory Visit: Payer: Self-pay | Admitting: Radiation Therapy

## 2022-07-03 DIAGNOSIS — E039 Hypothyroidism, unspecified: Secondary | ICD-10-CM | POA: Diagnosis not present

## 2022-07-07 ENCOUNTER — Other Ambulatory Visit: Payer: Self-pay

## 2022-07-08 ENCOUNTER — Other Ambulatory Visit: Payer: Self-pay

## 2022-07-08 DIAGNOSIS — C439 Malignant melanoma of skin, unspecified: Secondary | ICD-10-CM

## 2022-07-09 ENCOUNTER — Inpatient Hospital Stay: Payer: Medicare HMO

## 2022-07-09 ENCOUNTER — Other Ambulatory Visit: Payer: Self-pay

## 2022-07-09 VITALS — BP 122/58 | HR 81 | Temp 98.0°F | Resp 16 | Ht 66.0 in | Wt 105.8 lb

## 2022-07-09 DIAGNOSIS — Z79890 Hormone replacement therapy (postmenopausal): Secondary | ICD-10-CM | POA: Diagnosis not present

## 2022-07-09 DIAGNOSIS — R634 Abnormal weight loss: Secondary | ICD-10-CM | POA: Diagnosis not present

## 2022-07-09 DIAGNOSIS — C439 Malignant melanoma of skin, unspecified: Secondary | ICD-10-CM

## 2022-07-09 DIAGNOSIS — E039 Hypothyroidism, unspecified: Secondary | ICD-10-CM | POA: Diagnosis not present

## 2022-07-09 DIAGNOSIS — Z881 Allergy status to other antibiotic agents status: Secondary | ICD-10-CM | POA: Diagnosis not present

## 2022-07-09 DIAGNOSIS — Z809 Family history of malignant neoplasm, unspecified: Secondary | ICD-10-CM | POA: Diagnosis not present

## 2022-07-09 DIAGNOSIS — Z85820 Personal history of malignant melanoma of skin: Secondary | ICD-10-CM | POA: Diagnosis not present

## 2022-07-09 DIAGNOSIS — I700 Atherosclerosis of aorta: Secondary | ICD-10-CM | POA: Diagnosis not present

## 2022-07-09 DIAGNOSIS — Z5112 Encounter for antineoplastic immunotherapy: Secondary | ICD-10-CM | POA: Diagnosis not present

## 2022-07-09 DIAGNOSIS — Z8249 Family history of ischemic heart disease and other diseases of the circulatory system: Secondary | ICD-10-CM | POA: Diagnosis not present

## 2022-07-09 DIAGNOSIS — Z888 Allergy status to other drugs, medicaments and biological substances status: Secondary | ICD-10-CM | POA: Diagnosis not present

## 2022-07-09 DIAGNOSIS — C434 Malignant melanoma of scalp and neck: Secondary | ICD-10-CM | POA: Diagnosis not present

## 2022-07-09 DIAGNOSIS — R221 Localized swelling, mass and lump, neck: Secondary | ICD-10-CM | POA: Diagnosis not present

## 2022-07-09 DIAGNOSIS — R222 Localized swelling, mass and lump, trunk: Secondary | ICD-10-CM | POA: Diagnosis not present

## 2022-07-09 DIAGNOSIS — R2241 Localized swelling, mass and lump, right lower limb: Secondary | ICD-10-CM | POA: Diagnosis not present

## 2022-07-09 DIAGNOSIS — Z8349 Family history of other endocrine, nutritional and metabolic diseases: Secondary | ICD-10-CM | POA: Diagnosis not present

## 2022-07-09 DIAGNOSIS — N6320 Unspecified lump in the left breast, unspecified quadrant: Secondary | ICD-10-CM | POA: Diagnosis not present

## 2022-07-09 DIAGNOSIS — Z7189 Other specified counseling: Secondary | ICD-10-CM

## 2022-07-09 DIAGNOSIS — R918 Other nonspecific abnormal finding of lung field: Secondary | ICD-10-CM | POA: Diagnosis not present

## 2022-07-09 LAB — CBC WITH DIFFERENTIAL (CANCER CENTER ONLY)
Abs Immature Granulocytes: 0.01 10*3/uL (ref 0.00–0.07)
Basophils Absolute: 0 10*3/uL (ref 0.0–0.1)
Basophils Relative: 1 %
Eosinophils Absolute: 0.1 10*3/uL (ref 0.0–0.5)
Eosinophils Relative: 1 %
HCT: 35.9 % — ABNORMAL LOW (ref 36.0–46.0)
Hemoglobin: 12.2 g/dL (ref 12.0–15.0)
Immature Granulocytes: 0 %
Lymphocytes Relative: 29 %
Lymphs Abs: 1.1 10*3/uL (ref 0.7–4.0)
MCH: 29.7 pg (ref 26.0–34.0)
MCHC: 34 g/dL (ref 30.0–36.0)
MCV: 87.3 fL (ref 80.0–100.0)
Monocytes Absolute: 0.4 10*3/uL (ref 0.1–1.0)
Monocytes Relative: 11 %
Neutro Abs: 2.1 10*3/uL (ref 1.7–7.7)
Neutrophils Relative %: 58 %
Platelet Count: 193 10*3/uL (ref 150–400)
RBC: 4.11 MIL/uL (ref 3.87–5.11)
RDW: 12.8 % (ref 11.5–15.5)
WBC Count: 3.6 10*3/uL — ABNORMAL LOW (ref 4.0–10.5)
nRBC: 0 % (ref 0.0–0.2)

## 2022-07-09 LAB — CMP (CANCER CENTER ONLY)
ALT: 14 U/L (ref 0–44)
AST: 16 U/L (ref 15–41)
Albumin: 4.3 g/dL (ref 3.5–5.0)
Alkaline Phosphatase: 67 U/L (ref 38–126)
Anion gap: 5 (ref 5–15)
BUN: 18 mg/dL (ref 8–23)
CO2: 27 mmol/L (ref 22–32)
Calcium: 9.1 mg/dL (ref 8.9–10.3)
Chloride: 108 mmol/L (ref 98–111)
Creatinine: 0.73 mg/dL (ref 0.44–1.00)
GFR, Estimated: >60 mL/min (ref >60)
Glucose, Bld: 113 mg/dL — ABNORMAL HIGH (ref 70–99)
Potassium: 4 mmol/L (ref 3.5–5.1)
Sodium: 140 mmol/L (ref 135–145)
Total Bilirubin: 1.5 mg/dL — ABNORMAL HIGH (ref 0.3–1.2)
Total Protein: 6.6 g/dL (ref 6.5–8.1)

## 2022-07-09 LAB — LACTATE DEHYDROGENASE: LDH: 140 U/L (ref 98–192)

## 2022-07-09 MED ORDER — SODIUM CHLORIDE 0.9 % IV SOLN: Freq: Once | INTRAVENOUS | Status: AC

## 2022-07-09 MED ORDER — SODIUM CHLORIDE 0.9 % IV SOLN
1.0000 mg/kg | Freq: Once | INTRAVENOUS | Status: AC
  Administered 2022-07-09: 50 mg via INTRAVENOUS
  Filled 2022-07-09: qty 10

## 2022-07-09 MED ORDER — DIPHENHYDRAMINE HCL 50 MG/ML IJ SOLN
25.0000 mg | Freq: Once | INTRAMUSCULAR | Status: AC
  Administered 2022-07-09: 25 mg via INTRAVENOUS
  Filled 2022-07-09: qty 1

## 2022-07-09 MED ORDER — SODIUM CHLORIDE 0.9 % IV SOLN
3.0000 mg/kg | Freq: Once | INTRAVENOUS | Status: AC
  Administered 2022-07-09: 143 mg via INTRAVENOUS
  Filled 2022-07-09: qty 14.3

## 2022-07-09 MED ORDER — FAMOTIDINE IN NACL 20-0.9 MG/50ML-% IV SOLN
20.0000 mg | Freq: Once | INTRAVENOUS | Status: AC
  Administered 2022-07-09: 20 mg via INTRAVENOUS
  Filled 2022-07-09: qty 50

## 2022-07-09 NOTE — Patient Instructions (Signed)
[facility] MEDICAL [facility]COLOGY  Discharge Instructions: Thank you for choosing [facility] to provide your oncology and hematology care.   If you have a lab appointment with the Cancer Center, please go directly to the Cancer Center and check in at the registration area.   Wear comfortable clothing and clothing appropriate for easy access to any Portacath or PICC line.   We strive to give you quality time with your provider. You may need to reschedule your appointment if you arrive late (15 or more minutes).  Arriving late affects you and other patients whose appointments are after yours.  Also, if you miss three or more appointments without notifying the office, you may be dismissed from the clinic at the provider's discretion.      For prescription refill requests, have your pharmacy contact our office and allow 72 hours for refills to be completed.    Today you received the following chemotherapy and/or immunotherapy agents: Opdivo & Yervoy  To help prevent nausea and vomiting after your treatment, we encourage you to take your nausea medication as directed.  BELOW ARE SYMPTOMS THAT SHOULD BE REPORTED IMMEDIATELY: *FEVER GREATER THAN 100.4 F (38 C) OR HIGHER *CHILLS OR SWEATING *NAUSEA AND VOMITING THAT IS NOT CONTROLLED WITH YOUR NAUSEA MEDICATION *UNUSUAL SHORTNESS OF BREATH *UNUSUAL BRUISING OR BLEEDING *URINARY PROBLEMS (pain or burning when urinating, or frequent urination) *BOWEL PROBLEMS (unusual diarrhea, constipation, pain near the anus) TENDERNESS IN MOUTH AND THROAT WITH OR WITHOUT PRESENCE OF ULCERS (sore throat, sores in mouth, or a toothache) UNUSUAL RASH, SWELLING OR PAIN  UNUSUAL VAGINAL DISCHARGE OR ITCHING   Items with * indicate a potential emergency and should be followed up as soon as possible or go to the Emergency Department if any problems should occur.  Please show the CHEMOTHERAPY ALERT CARD or IMMUNOTHERAPY ALERT CARD at check-in  to the Emergency Department and triage nurse.  Should you have questions after your visit or need to cancel or reschedule your appointment, please contact [facility] MEDICAL [facility]COL[facility]  Dept: [phone]  and follow the prompts.  Office hours are 8:00 a.m. to 4:30 p.m. Monday - Friday. Please note that voicemails left after 4:00 p.m. may not be returned until the following business day.  We are closed weekends and major holidays. You have access to a nurse at all times for urgent questions. Please call the main number to the clinic Dept: [phone] and follow the prompts.   For any non-urgent questions, you may also contact your provider using MyChart. We now offer e-Visits for anyone 18 and older to request care online for non-urgent symptoms. For details visit [email or web address].   Also download the MyChart app! Go to the app store, search "MyChart", open the app, select , and log in with your MyChart username and password.  Masks are optional in the cancer centers. If you would like for your care team to wear a mask while they are taking care of you, please let them know. For doctor visits, patients may have with them one support person who is at least 66 years old. At this time, visitors are not allowed in the infusion area. 

## 2022-07-09 NOTE — Progress Notes (Signed)
Ipilimumab (YERVOY) Patient Monitoring Assessment   Is the patient experiencing any of the following general symptoms?:  '[]'$ Difficulty performing normal activities '[]'$ Feeling sluggish or cold all the time '[]'$ Unusual weight gain '[]'$ Constant or unusual headaches '[]'$ Feeling dizzy or faint '[]'$ Changes in eyesight (blurry vision, double vision, or other vision problems) '[]'$ Changes in mood or behavior (ex: decreased sex drive, irritability, or forgetfulness) '[]'$ Starting new medications (ex: steroids, other medications that lower immune response) '[x]'$ Patient is not experiencing any of the general symptoms above.    Gastrointestinal  Patient is having 1-2 bowel movements each day.  Is this different from baseline? '[]'$ Yes '[x]'$ No Are your stools watery or do they have a foul smell? '[]'$ Yes '[x]'$ No Have you seen blood in your stools? '[]'$ Yes '[x]'$ No Are your stools dark, tarry, or sticky? '[]'$ Yes '[x]'$ No Are you having pain or tenderness in your belly? '[]'$ Yes '[x]'$ No  Skin Does your skin itch? '[]'$ Yes '[x]'$ No Do you have a rash? '[]'$ Yes '[x]'$ No Has your skin blistered and/or peeled? '[]'$ Yes '[x]'$ No Do you have sores in your mouth? '[]'$ Yes '[x]'$ No  Hepatic Has your urine been dark or tea colored? '[]'$ Yes '[x]'$ No Have you noticed that your skin or the whites of your eyes are turning yellow? '[]'$ Yes '[x]'$ No Are you bleeding or bruising more easily than normal? '[]'$ Yes '[x]'$ No Are you nauseous and/or vomiting? '[]'$ Yes '[x]'$ No Do you have pain on the right side of your stomach? '[]'$ Yes '[x]'$ No  Neurologic  Are you having unusual weakness of legs, arms, or face? '[]'$ Yes '[x]'$ No Are you having numbness or tingling in your hands or feet? '[]'$ Yes '[x]'$ No  Nira Retort

## 2022-07-10 ENCOUNTER — Telehealth: Payer: Self-pay

## 2022-07-10 NOTE — Telephone Encounter (Signed)
Pt called in to ask a question about medications given the day before, clarification of meds. Pt stated she was feeling great and had no c/o from her treatment. Pt stated no questions or concerns.

## 2022-07-18 ENCOUNTER — Inpatient Hospital Stay: Payer: Medicare HMO | Attending: Hematology | Admitting: Hematology

## 2022-07-18 ENCOUNTER — Other Ambulatory Visit: Payer: Self-pay

## 2022-07-18 VITALS — BP 124/66 | HR 75 | Temp 97.7°F | Resp 16 | Ht 66.0 in | Wt 106.5 lb

## 2022-07-18 DIAGNOSIS — C792 Secondary malignant neoplasm of skin: Secondary | ICD-10-CM | POA: Insufficient documentation

## 2022-07-18 DIAGNOSIS — Z888 Allergy status to other drugs, medicaments and biological substances status: Secondary | ICD-10-CM | POA: Insufficient documentation

## 2022-07-18 DIAGNOSIS — Z79890 Hormone replacement therapy (postmenopausal): Secondary | ICD-10-CM | POA: Insufficient documentation

## 2022-07-18 DIAGNOSIS — E039 Hypothyroidism, unspecified: Secondary | ICD-10-CM | POA: Diagnosis not present

## 2022-07-18 DIAGNOSIS — Z808 Family history of malignant neoplasm of other organs or systems: Secondary | ICD-10-CM | POA: Insufficient documentation

## 2022-07-18 DIAGNOSIS — Z79899 Other long term (current) drug therapy: Secondary | ICD-10-CM | POA: Insufficient documentation

## 2022-07-18 DIAGNOSIS — Z8349 Family history of other endocrine, nutritional and metabolic diseases: Secondary | ICD-10-CM | POA: Diagnosis not present

## 2022-07-18 DIAGNOSIS — Z8249 Family history of ischemic heart disease and other diseases of the circulatory system: Secondary | ICD-10-CM | POA: Diagnosis not present

## 2022-07-18 DIAGNOSIS — G9389 Other specified disorders of brain: Secondary | ICD-10-CM | POA: Insufficient documentation

## 2022-07-18 DIAGNOSIS — Z881 Allergy status to other antibiotic agents status: Secondary | ICD-10-CM | POA: Diagnosis not present

## 2022-07-18 DIAGNOSIS — C439 Malignant melanoma of skin, unspecified: Secondary | ICD-10-CM

## 2022-07-18 DIAGNOSIS — Z5112 Encounter for antineoplastic immunotherapy: Secondary | ICD-10-CM | POA: Diagnosis not present

## 2022-07-18 DIAGNOSIS — K589 Irritable bowel syndrome without diarrhea: Secondary | ICD-10-CM | POA: Diagnosis not present

## 2022-07-18 DIAGNOSIS — G939 Disorder of brain, unspecified: Secondary | ICD-10-CM

## 2022-07-18 DIAGNOSIS — C434 Malignant melanoma of scalp and neck: Secondary | ICD-10-CM | POA: Diagnosis present

## 2022-07-22 ENCOUNTER — Telehealth: Payer: Self-pay | Admitting: Hematology

## 2022-07-22 ENCOUNTER — Ambulatory Visit: Payer: Medicare HMO | Admitting: Hematology

## 2022-07-22 NOTE — Telephone Encounter (Signed)
Scheduled per 08/04 los, patient has been called and notified.

## 2022-07-24 ENCOUNTER — Encounter: Payer: Self-pay | Admitting: Hematology

## 2022-07-24 NOTE — Progress Notes (Signed)
Marland Kitchen   HEMATOLOGY/ONCOLOGY PHONE VISIT NOTE  Date of Service:07/18/2022    Patient Care Team: Midge Minium, MD as PCP - General (Family Medicine) Monna Fam, MD as Consulting Physician (Ophthalmology) Lady Gary, [facility]  CHIEF COMPLAINTS/PURPOSE OF CONSULTATION:  Follow-up for management of metastatic melanoma  HISTORY OF PRESENTING ILLNESS:  Please see previous note for details on initial presentation  INTERVAL HISTORY  [name] is here for follow-up of her metastatic melanoma and for toxicity check after her first cycle of immunotherapy with ipilimumab/nivolumab. Patient notes no acute toxicities from her immunotherapy.  Notes some grade 1 fatigue for a few days after treatment.  No new skin rashes.  No diarrhea.  No other acute new focal symptoms. No new headaches or focal neurological deficits. She was evaluated by Dr. Mickeal Skinner for the lesion in the brain and she will be getting an interval MRI for interval reevaluation of that lesion to determine if it is a metastatic lesion or a abnormal blood vessel.  Patient notes she has been doing well overall and has been eating well.  She and her husband note that she did take empiric ivermectin prior to initiation of her immunotherapy based on advice from another doctor. Patient and her husband also notes that she might be moving to [place] close to [place] once the timing of this is decided she will let us know so we can give her a referral to transfer care to [facility].  Patient notes no other acute new symptoms at this time.  Labs done today were discussed in detail with her.   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 Dr Sharol Roussel   Anxiety    Arthritis    Atrophic vaginitis    Diverticulosis 02/2008   Enteritis, candida 2011   GERD  (gastroesophageal reflux disease)    Gluten intolerance 2012   H/O bilateral breast implants    Hypothyroidism    Melanoma ([place]) 2001   back of neck   PMB (postmenopausal bleeding) 09/2012   On HRT - PUS/ SHGM with fibroids   Sleep apnea   Irritable bowel syndrome Melanoma back of neck - 2001 -- dermatologist every year -- [facility] Dr [place].  SURGICAL HISTORY: Past Surgical History:  Procedure Laterality Date   BREAST ENHANCEMENT SURGERY Bilateral 30 years ago   silicone, replaced with saline 6 years ago   [place]     over 64yr ago for abnormal pap smear   MASS EXCISION N/A 06/02/2022   Procedure: EXCISION SKIN NODULES LEFT BREAST, Left Arm,ABDOMINAL WALL, AND RIGHT NECK;  Surgeon: BCoralie Keens MD;  [facility]  Service: General;  Laterality: N/A;   MELANOMA EXCISION  2001   back of neck   MYOMECTOMY  about 1987   for fibroids   UMBILICAL HERNIA REPAIR N/A 07/31/2021   Procedure: UMBILICAL HERNIA REPAIR;  Surgeon: BCoralie Keens MD;  [place] [facility]  Service: General;  Laterality: N/A;  LMA    SOCIAL HISTORY: Social History   Socioeconomic History   Marital status: Married    Spouse name: Not on file   Number of children: 0   Years of education: Not on file   Highest education level: Not on file  Occupational History   Not on file  Tobacco  Use   Smoking status: Never   Smokeless tobacco: Never  Vaping Use   Vaping Use: Never used  Substance and Sexual Activity   Alcohol use: No   Drug use: No   Sexual activity: Not Currently    Partners: Male    Birth control/protection: Post-menopausal  Other Topics Concern   Not on file  Social History Narrative   Not on file   Social Determinants of Health   Financial Resource Strain: Not on file  Food Insecurity: Not on file  Transportation Needs: Not on file  Physical Activity: Not on file  Stress: Not on file  Social Connections: Not on file   Intimate Partner Violence: Not on file  No cigarettes Marijuana in the past ETOH social use  FAMILY HISTORY: Family History  Problem Relation Age of Onset   Hypertension Mother    Hyperlipidemia Mother    Cancer Father   Father-- brain cancer   ALLERGIES:  is allergic to prednisone and cephalexin. Milk is diarrhea Amitriptyline-excessive somnolence Gluten Testosterone HRT cream-  MEDICATIONS:  Current Outpatient Medications  Medication Sig Dispense Refill   levothyroxine (SYNTHROID) 50 MCG tablet Take 1 tablet (50 mcg total) by mouth daily before breakfast. (Patient taking differently: Take 50 mcg by mouth daily before breakfast. 25 mcg daily) 90 tablet 0   liothyronine (CYTOMEL) 5 MCG tablet Take 2 tablets (10 mcg total) by mouth daily before breakfast. (Patient taking differently: Take 2.5 mcg by mouth daily before breakfast.) 180 tablet 0   Propylene Glycol (SYSTANE BALANCE) 0.6 % SOLN Place 1 drop into both eyes in the morning and at bedtime.     ondansetron (ZOFRAN) 8 MG tablet Take 1 tablet (8 mg total) by mouth every 8 (eight) hours as needed for nausea or vomiting. (Patient not taking: Reported on 06/30/2022) 20 tablet 0   No current facility-administered medications for this visit.    REVIEW OF SYSTEMS:   10 Point review of Systems was done is negative except as noted above.  PHYSICAL EXAMINATION: Telemedicine appointment  LABORATORY DATA:  I have reviewed the data as listed  .    Latest Ref Rng & Units 07/09/2022   11:36 AM 06/02/2022   10:57 AM 04/18/2022    2:35 PM  CBC  WBC 4.0 - 10.5 K/uL 3.6  3.5  3.7   Hemoglobin 12.0 - 15.0 g/dL 12.2  12.8  12.8   Hematocrit 36.0 - 46.0 % 35.9  37.4  38.8   Platelets 150 - 400 K/uL 193  177  216     .    Latest Ref Rng & Units 07/09/2022   11:36 AM 06/02/2022   10:57 AM 04/18/2022    2:35 PM  CMP  Glucose 70 - 99 mg/dL 113  99  93   BUN 8 - 23 mg/dL '18  20  22   '$ Creatinine 0.44 - 1.00 mg/dL 0.73  0.78  0.74    Sodium 135 - 145 mmol/L 140  139  138   Potassium 3.5 - 5.1 mmol/L 4.0  3.8  4.0   Chloride 98 - 111 mmol/L 108  102  104   CO2 22 - 32 mmol/L '27  25  28   '$ Calcium 8.9 - 10.3 mg/dL 9.1  9.0  9.2   Total Protein 6.5 - 8.1 g/dL 6.6   7.1   Total Bilirubin 0.3 - 1.2 mg/dL 1.5   1.2   Alkaline Phos 38 - 126 U/L 67   75  AST 15 - 41 U/L 16   22   ALT 0 - 44 U/L 14   22    . Lab Results  Component Value Date   LDH 140 07/09/2022   CYTOLOGY - NON PAP  CASE: [id number]  PATIENT: Frances Bright  Non-Gynecological Cytology Report      Clinical History: Melanoma of the neck 20 years ago, multiple new small  sub Q nodules  Specimen Submitted:  A. SUBCUTANEOUS NODULE, LEFT LATERAL THIGH, FINE  NEEDLE ASPIRATION:    FINAL MICROSCOPIC DIAGNOSIS:  - Malignant cells present  - Consistent with metastatic melanoma   RADIOGRAPHIC STUDIES: I have personally reviewed the radiological images as listed and agreed with the findings in the report. No results found.  ASSESSMENT & PLAN:  66 year old female with  #1  Newly diagnosed metastatic melanoma presenting with right upper neck mass and numerous skin nodules over upper extremities trunk and upper proximal thigh. 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 Plan -Patient's previous labs were discussed in detail with her. She has no focal symptoms suggestive of immunotherapy toxicity at this time.  No skin rashes no diarrhea no other acute new focal symptoms. -She noted some grade 1 fatigue for a few days but otherwise has been doing well. -She will be scheduled for her second dose of ipilimumab/nivolumab with labs and MD visit. -She has been seen by Dr. Mickeal Skinner from neuro-oncology regarding the suspicious lesion in the brain and has been scheduled for a repeat MRI on 08/01/2022 with a follow-up  with your oncology at that time. -We discussed that we would plan to do 4 cycles of AP/Nivo followed by monthly nivolumab till progression and intolerance or complete remission for 2 years. -If the MRI of the brain shows concerns for metastatic lesion in the brain she might need to be considered for [facility] treatment and be referred to radiation oncology.  Follow-up Follow-up as per next 3 scheduled cycles of ipilimumab/nivolumab with labs and MD visits  The total time spent in the appointment was 30 minutes*.  All of the patient's questions were answered with apparent satisfaction. The patient knows to call the clinic with any problems, questions or concerns.   Sullivan Lone MD MS AAHIVMS [facility] Hematology/Oncology [facility]ician [facility]  .*Total Encounter Time as defined by the Centers for Medicare and Medicaid Services includes, in addition to the face-to-face time of a patient visit (documented in the note above) non-face-to-face time: obtaining and reviewing outside history, ordering and reviewing medications, tests or procedures, care coordination (communications with other health care professionals or caregivers) and documentation in the medical record.

## 2022-07-25 ENCOUNTER — Other Ambulatory Visit: Payer: Self-pay | Admitting: *Deleted

## 2022-07-25 DIAGNOSIS — C439 Malignant melanoma of skin, unspecified: Secondary | ICD-10-CM

## 2022-07-30 ENCOUNTER — Inpatient Hospital Stay: Payer: Medicare HMO

## 2022-07-30 ENCOUNTER — Inpatient Hospital Stay: Payer: Medicare HMO | Admitting: Hematology

## 2022-07-30 ENCOUNTER — Other Ambulatory Visit: Payer: Self-pay

## 2022-07-30 ENCOUNTER — Other Ambulatory Visit: Payer: Self-pay | Admitting: Hematology

## 2022-07-30 ENCOUNTER — Ambulatory Visit: Payer: Medicare HMO

## 2022-07-30 ENCOUNTER — Other Ambulatory Visit: Payer: Medicare HMO

## 2022-07-30 VITALS — BP 116/75 | HR 72 | Temp 98.9°F | Resp 16 | Wt 105.5 lb

## 2022-07-30 DIAGNOSIS — K589 Irritable bowel syndrome without diarrhea: Secondary | ICD-10-CM | POA: Diagnosis not present

## 2022-07-30 DIAGNOSIS — C434 Malignant melanoma of scalp and neck: Secondary | ICD-10-CM | POA: Diagnosis not present

## 2022-07-30 DIAGNOSIS — C439 Malignant melanoma of skin, unspecified: Secondary | ICD-10-CM

## 2022-07-30 DIAGNOSIS — C792 Secondary malignant neoplasm of skin: Secondary | ICD-10-CM | POA: Diagnosis not present

## 2022-07-30 DIAGNOSIS — G9389 Other specified disorders of brain: Secondary | ICD-10-CM | POA: Diagnosis not present

## 2022-07-30 DIAGNOSIS — Z8349 Family history of other endocrine, nutritional and metabolic diseases: Secondary | ICD-10-CM | POA: Diagnosis not present

## 2022-07-30 DIAGNOSIS — Z79899 Other long term (current) drug therapy: Secondary | ICD-10-CM | POA: Diagnosis not present

## 2022-07-30 DIAGNOSIS — Z8249 Family history of ischemic heart disease and other diseases of the circulatory system: Secondary | ICD-10-CM | POA: Diagnosis not present

## 2022-07-30 DIAGNOSIS — Z5112 Encounter for antineoplastic immunotherapy: Secondary | ICD-10-CM | POA: Diagnosis not present

## 2022-07-30 DIAGNOSIS — E039 Hypothyroidism, unspecified: Secondary | ICD-10-CM | POA: Diagnosis not present

## 2022-07-30 DIAGNOSIS — Z7189 Other specified counseling: Secondary | ICD-10-CM

## 2022-07-30 DIAGNOSIS — Z79890 Hormone replacement therapy (postmenopausal): Secondary | ICD-10-CM | POA: Diagnosis not present

## 2022-07-30 DIAGNOSIS — Z888 Allergy status to other drugs, medicaments and biological substances status: Secondary | ICD-10-CM | POA: Diagnosis not present

## 2022-07-30 DIAGNOSIS — Z881 Allergy status to other antibiotic agents status: Secondary | ICD-10-CM | POA: Diagnosis not present

## 2022-07-30 DIAGNOSIS — Z808 Family history of malignant neoplasm of other organs or systems: Secondary | ICD-10-CM | POA: Diagnosis not present

## 2022-07-30 LAB — CBC WITH DIFFERENTIAL (CANCER CENTER ONLY)
Abs Immature Granulocytes: 0.01 10*3/uL (ref 0.00–0.07)
Basophils Absolute: 0 10*3/uL (ref 0.0–0.1)
Basophils Relative: 1 %
Eosinophils Absolute: 0.1 10*3/uL (ref 0.0–0.5)
Eosinophils Relative: 4 %
HCT: 36.6 % (ref 36.0–46.0)
Hemoglobin: 12.6 g/dL (ref 12.0–15.0)
Immature Granulocytes: 0 %
Lymphocytes Relative: 29 %
Lymphs Abs: 1.2 10*3/uL (ref 0.7–4.0)
MCH: 30.6 pg (ref 26.0–34.0)
MCHC: 34.4 g/dL (ref 30.0–36.0)
MCV: 88.8 fL (ref 80.0–100.0)
Monocytes Absolute: 0.3 10*3/uL (ref 0.1–1.0)
Monocytes Relative: 8 %
Neutro Abs: 2.4 10*3/uL (ref 1.7–7.7)
Neutrophils Relative %: 58 %
Platelet Count: 253 10*3/uL (ref 150–400)
RBC: 4.12 MIL/uL (ref 3.87–5.11)
RDW: 13.1 % (ref 11.5–15.5)
WBC Count: 4 10*3/uL (ref 4.0–10.5)
nRBC: 0 % (ref 0.0–0.2)

## 2022-07-30 LAB — CMP (CANCER CENTER ONLY)
ALT: 29 U/L (ref 0–44)
AST: 24 U/L (ref 15–41)
Albumin: 4.3 g/dL (ref 3.5–5.0)
Alkaline Phosphatase: 63 U/L (ref 38–126)
Anion gap: 7 (ref 5–15)
BUN: 19 mg/dL (ref 8–23)
CO2: 23 mmol/L (ref 22–32)
Calcium: 9.1 mg/dL (ref 8.9–10.3)
Chloride: 108 mmol/L (ref 98–111)
Creatinine: 0.82 mg/dL (ref 0.44–1.00)
GFR, Estimated: >60 mL/min (ref >60)
Glucose, Bld: 102 mg/dL — ABNORMAL HIGH (ref 70–99)
Potassium: 3.7 mmol/L (ref 3.5–5.1)
Sodium: 138 mmol/L (ref 135–145)
Total Bilirubin: 0.8 mg/dL (ref 0.3–1.2)
Total Protein: 6.8 g/dL (ref 6.5–8.1)

## 2022-07-30 LAB — LACTATE DEHYDROGENASE: LDH: 151 U/L (ref 98–192)

## 2022-07-30 MED ORDER — SODIUM CHLORIDE 0.9 % IV SOLN
3.0000 mg/kg | Freq: Once | INTRAVENOUS | Status: AC
  Administered 2022-07-30: 143 mg via INTRAVENOUS
  Filled 2022-07-30: qty 10

## 2022-07-30 MED ORDER — FAMOTIDINE 20 MG PO TABS: 20.0000 mg | ORAL_TABLET | Freq: Every day | ORAL | 2 refills | Status: DC

## 2022-07-30 MED ORDER — HYDROXYZINE HCL 25 MG PO TABS: 25.0000 mg | ORAL_TABLET | Freq: Three times a day (TID) | ORAL | 0 refills | Status: DC | PRN

## 2022-07-30 MED ORDER — CETIRIZINE HCL 10 MG PO TABS
10.0000 mg | ORAL_TABLET | Freq: Every day | ORAL | Status: DC
  Administered 2022-07-30: 10 mg via ORAL
  Filled 2022-07-30: qty 1

## 2022-07-30 MED ORDER — FAMOTIDINE IN NACL 20-0.9 MG/50ML-% IV SOLN
20.0000 mg | Freq: Once | INTRAVENOUS | Status: AC
  Administered 2022-07-30: 20 mg via INTRAVENOUS

## 2022-07-30 MED ORDER — LEVOTHYROXINE SODIUM 50 MCG PO TABS: 25.0000 ug | ORAL_TABLET | Freq: Every day | ORAL | 0 refills | Status: AC

## 2022-07-30 MED ORDER — LIOTHYRONINE SODIUM 5 MCG PO TABS: 5.0000 ug | ORAL_TABLET | Freq: Every day | ORAL | 0 refills | Status: AC

## 2022-07-30 MED ORDER — TRIAMCINOLONE ACETONIDE 0.025 % EX OINT: 1.0000 | TOPICAL_OINTMENT | Freq: Two times a day (BID) | CUTANEOUS | 1 refills | Status: AC

## 2022-07-30 MED ORDER — SODIUM CHLORIDE 0.9 % IV SOLN: Freq: Once | INTRAVENOUS | Status: AC

## 2022-07-30 MED ORDER — LORATADINE 10 MG PO TABS: 10.0000 mg | ORAL_TABLET | Freq: Every day | ORAL | 2 refills | Status: DC

## 2022-07-30 NOTE — Patient Instructions (Signed)
[facility] MEDICAL ONCOLOGY  Discharge Instructions: Thank you for choosing [facility] to provide your oncology and hematology care.   If you have a lab appointment with the Cancer Center, please go directly to the Cancer Center and check in at the registration area.   Wear comfortable clothing and clothing appropriate for easy access to any Portacath or PICC line.   We strive to give you quality time with your provider. You may need to reschedule your appointment if you arrive late (15 or more minutes).  Arriving late affects you and other patients whose appointments are after yours.  Also, if you miss three or more appointments without notifying the office, you may be dismissed from the clinic at the provider's discretion.      For prescription refill requests, have your pharmacy contact our office and allow 72 hours for refills to be completed.    Today you received the following chemotherapy and/or immunotherapy agents: Opdivo      To help prevent nausea and vomiting after your treatment, we encourage you to take your nausea medication as directed.  BELOW ARE SYMPTOMS THAT SHOULD BE REPORTED IMMEDIATELY: *FEVER GREATER THAN 100.4 F (38 C) OR HIGHER *CHILLS OR SWEATING *NAUSEA AND VOMITING THAT IS NOT CONTROLLED WITH YOUR NAUSEA MEDICATION *UNUSUAL SHORTNESS OF BREATH *UNUSUAL BRUISING OR BLEEDING *URINARY PROBLEMS (pain or burning when urinating, or frequent urination) *BOWEL PROBLEMS (unusual diarrhea, constipation, pain near the anus) TENDERNESS IN MOUTH AND THROAT WITH OR WITHOUT PRESENCE OF ULCERS (sore throat, sores in mouth, or a toothache) UNUSUAL RASH, SWELLING OR PAIN  UNUSUAL VAGINAL DISCHARGE OR ITCHING   Items with * indicate a potential emergency and should be followed up as soon as possible or go to the Emergency Department if any problems should occur.  Please show the CHEMOTHERAPY ALERT CARD or IMMUNOTHERAPY ALERT CARD at check-in to the  Emergency Department and triage nurse.  Should you have questions after your visit or need to cancel or reschedule your appointment, please contact [facility] MEDICAL ONCOLOGY  Dept: [phone]  and follow the prompts.  Office hours are 8:00 a.m. to 4:30 p.m. Monday - Friday. Please note that voicemails left after 4:00 p.m. may not be returned until the following business day.  We are closed weekends and major holidays. You have access to a nurse at all times for urgent questions. Please call the main number to the clinic Dept: [phone] and follow the prompts.   For any non-urgent questions, you may also contact your provider using MyChart. We now offer e-Visits for anyone 18 and older to request care online for non-urgent symptoms. For details visit [email or web address].   Also download the MyChart app! Go to the app store, search "MyChart", open the app, select [facility], and log in with your MyChart username and password.  Masks are optional in the cancer centers. If you would like for your care team to wear a mask while they are taking care of you, please let them know. You may have one support person who is at least 66 years old accompany you for your appointments. 

## 2022-08-01 ENCOUNTER — Ambulatory Visit
Admission: RE | Admit: 2022-08-01 | Discharge: 2022-08-01 | Disposition: A | Discharge status: Home / Self Care | Payer: Medicare HMO | Source: Ambulatory Visit | Attending: Internal Medicine | Admitting: Internal Medicine

## 2022-08-01 DIAGNOSIS — C439 Malignant melanoma of skin, unspecified: Secondary | ICD-10-CM | POA: Diagnosis not present

## 2022-08-01 MED ORDER — GADOBENATE DIMEGLUMINE 529 MG/ML IV SOLN
9.0000 mL | Freq: Once | INTRAVENOUS | Status: AC | PRN
  Administered 2022-08-01: 9 mL via INTRAVENOUS

## 2022-08-04 ENCOUNTER — Inpatient Hospital Stay: Payer: Medicare HMO | Admitting: Internal Medicine

## 2022-08-04 ENCOUNTER — Inpatient Hospital Stay: Payer: Medicare HMO

## 2022-08-04 DIAGNOSIS — Z79899 Other long term (current) drug therapy: Secondary | ICD-10-CM | POA: Diagnosis not present

## 2022-08-04 DIAGNOSIS — C792 Secondary malignant neoplasm of skin: Secondary | ICD-10-CM | POA: Diagnosis not present

## 2022-08-04 DIAGNOSIS — C434 Malignant melanoma of scalp and neck: Secondary | ICD-10-CM | POA: Diagnosis not present

## 2022-08-04 DIAGNOSIS — Z8349 Family history of other endocrine, nutritional and metabolic diseases: Secondary | ICD-10-CM | POA: Diagnosis not present

## 2022-08-04 DIAGNOSIS — G9389 Other specified disorders of brain: Secondary | ICD-10-CM | POA: Diagnosis not present

## 2022-08-04 DIAGNOSIS — Z881 Allergy status to other antibiotic agents status: Secondary | ICD-10-CM | POA: Diagnosis not present

## 2022-08-04 DIAGNOSIS — Z808 Family history of malignant neoplasm of other organs or systems: Secondary | ICD-10-CM | POA: Diagnosis not present

## 2022-08-04 DIAGNOSIS — Z8249 Family history of ischemic heart disease and other diseases of the circulatory system: Secondary | ICD-10-CM | POA: Diagnosis not present

## 2022-08-04 DIAGNOSIS — R9089 Other abnormal findings on diagnostic imaging of central nervous system: Secondary | ICD-10-CM

## 2022-08-04 DIAGNOSIS — Z5112 Encounter for antineoplastic immunotherapy: Secondary | ICD-10-CM | POA: Diagnosis not present

## 2022-08-04 DIAGNOSIS — K589 Irritable bowel syndrome without diarrhea: Secondary | ICD-10-CM | POA: Diagnosis not present

## 2022-08-04 DIAGNOSIS — E039 Hypothyroidism, unspecified: Secondary | ICD-10-CM | POA: Diagnosis not present

## 2022-08-04 DIAGNOSIS — Z79890 Hormone replacement therapy (postmenopausal): Secondary | ICD-10-CM | POA: Diagnosis not present

## 2022-08-04 DIAGNOSIS — Z888 Allergy status to other drugs, medicaments and biological substances status: Secondary | ICD-10-CM | POA: Diagnosis not present

## 2022-08-04 NOTE — Progress Notes (Signed)
[facility] [place], [place] [address] [phone]   Interval Evaluation  Date of Service: 08/04/22 Patient Name: Frances Bright Patient MRN: [id number] Patient DOB: 1956-10-20 Provider: Ventura Sellers, MD  Identifying Statement:  Frances Bright is a 66 y.o. female with Abnormal finding on MRI of brain   Primary Cancer:  Oncologic History: Oncology History  Metastatic melanoma ([place])  06/22/2022 Initial Diagnosis   Metastatic melanoma ([place])   07/09/2022 -  Chemotherapy   Patient is on Treatment Plan : MELANOMA Nivolumab + Ipilimumab q21d / Nivolumab q28d       Interval History: Frances Bright presents today for follow up after MRI scan.  She continues to describe no neurologic complaints.  Getting ipi and nivo with Dr. Irene Limbo.  Maintains functional independence, denies headaches, seizures.  Medications: Current Outpatient Medications on File Prior to Visit  Medication Sig Dispense Refill   famotidine (PEPCID) 20 MG tablet Take 1 tablet (20 mg total) by mouth daily. 30 tablet 2   hydrOXYzine (ATARAX) 25 MG tablet Take 1 tablet (25 mg total) by mouth 3 (three) times daily as needed. 30 tablet 0   levothyroxine (SYNTHROID) 50 MCG tablet Take 0.5 tablets (25 mcg total) by mouth daily before breakfast. 90 tablet 0   liothyronine (CYTOMEL) 5 MCG tablet Take 1 tablet (5 mcg total) by mouth daily before breakfast. 180 tablet 0   loratadine (CLARITIN) 10 MG tablet Take 1 tablet (10 mg total) by mouth daily. 30 tablet 2   ondansetron (ZOFRAN) 8 MG tablet Take 1 tablet (8 mg total) by mouth every 8 (eight) hours as needed for nausea or vomiting. (Patient not taking: Reported on 06/30/2022) 20 tablet 0   Propylene Glycol (SYSTANE BALANCE) 0.6 % SOLN Place 1 drop into both eyes in the morning and at bedtime.     triamcinolone (KENALOG) 0.025 % ointment Apply 1 Application topically 2 (two) times daily. To rash on trunk and  extremities. Do not use on face. 30 g 1   No current facility-administered medications on file prior to visit.    Allergies:  Allergies  Allergen Reactions   Prednisone Swelling and Other (See Comments)    Patient noted leg swelling and severe headache with initiation of prednisone. Dr. Irene Limbo aware. Pt noted resolution of s/s after d/c medication.   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 Dr Sharol Roussel   Anxiety    Arthritis    Atrophic vaginitis    Diverticulosis 02/2008   Enteritis, candida 2011   GERD (gastroesophageal reflux disease)    Gluten intolerance 2012   H/O bilateral breast implants    Hypothyroidism    Melanoma ([place]) 2001   back of neck   PMB (postmenopausal bleeding) 09/2012   On HRT - PUS/ SHGM with fibroids   Sleep apnea    Past Surgical History:  Past Surgical History:  Procedure Laterality Date   BREAST ENHANCEMENT SURGERY Bilateral 30 years ago   silicone, replaced with saline 6 years ago   [place]     over 38yr ago for abnormal pap smear   MASS EXCISION N/A 06/02/2022   Procedure: EXCISION SKIN NODULES LEFT BREAST, Left Arm,ABDOMINAL WALL, AND RIGHT NECK;  Surgeon: BCoralie Keens MD;  Location: [facility];  Service: General;  Laterality: N/A;   MELANOMA EXCISION  2001   back of neck   MYOMECTOMY  about 1987   for fibroids   UMBILICAL HERNIA REPAIR N/A 07/31/2021   Procedure: UMBILICAL HERNIA REPAIR;  Surgeon: Coralie Keens, MD;  Location: [facility];  Service: General;  Laterality: N/A;  LMA   Social History:  Social History   Socioeconomic History   Marital status: Married    Spouse name: Not on file   Number of children: 0   Years of education: Not on file   Highest education level: Not on file  Occupational History   Not on file  Tobacco Use   Smoking  status: Never   Smokeless tobacco: Never  Vaping Use   Vaping Use: Never used  Substance and Sexual Activity   Alcohol use: No   Drug use: No   Sexual activity: Not Currently    Partners: Male    Birth control/protection: Post-menopausal  Other Topics Concern   Not on file  Social History Narrative   Not on file   Social Determinants of Health   Financial Resource Strain: Not on file  Food Insecurity: Not on file  Transportation Needs: Not on file  Physical Activity: Not on file  Stress: Not on file  Social Connections: Not on file  Intimate Partner Violence: Not on file   Family History:  Family History  Problem Relation Age of Onset   Hypertension Mother    Hyperlipidemia Mother    Cancer Father     Review of Systems: Constitutional: Doesn't report fevers, chills or abnormal weight loss Eyes: Doesn't report blurriness of vision Ears, nose, mouth, throat, and face: Doesn't report sore throat Respiratory: Doesn't report cough, dyspnea or wheezes Cardiovascular: Doesn't report palpitation, chest discomfort  Gastrointestinal:  Doesn't report nausea, constipation, diarrhea GU: Doesn't report incontinence Skin: Doesn't report skin rashes Neurological: Per HPI Musculoskeletal: Doesn't report joint pain Behavioral/Psych: Doesn't report anxiety  Physical Exam: Vitals:   08/04/22 1141  BP: 123/70  Pulse: 75  Resp: 17  Temp: 98.2 F (36.8 C)  SpO2: 100%    KPS: 90. General: Alert, cooperative, pleasant, in no acute distress Head: Normal EENT: No conjunctival injection or scleral icterus.  Lungs: Resp effort normal Cardiac: Regular rate Abdomen: Non-distended abdomen Skin: No rashes cyanosis or petechiae. Extremities: No clubbing or edema  Neurologic Exam: Mental Status: Awake, alert, attentive to examiner. Oriented to self and environment. Language is fluent with intact comprehension.  Cranial Nerves: Visual acuity is grossly normal. Extra-ocular movements  intact. No ptosis. Face is symmetric Motor: Tone and bulk are normal. Power is full in both arms and legs.  Sensory: Intact Gait: Normal.   Labs: I have reviewed the data as listed    Component Value Date/Time   NA 138 07/30/2022 1209   K 3.7 07/30/2022 1209   CL 108 07/30/2022 1209   CO2 23 07/30/2022 1209   GLUCOSE 102 (H) 07/30/2022 1209   BUN 19 07/30/2022 1209   CREATININE 0.82 07/30/2022 1209   CALCIUM 9.1 07/30/2022 1209   PROT 6.8 07/30/2022 1209   ALBUMIN 4.3 07/30/2022 1209   AST 24 07/30/2022 1209   ALT 29 07/30/2022 1209   ALKPHOS 63 07/30/2022 1209   BILITOT 0.8 07/30/2022 1209   GFRNONAA >60 07/30/2022 1209   GFRAA 113 01/12/2009 0954   Lab Results  Component Value Date   WBC 4.0 07/30/2022   NEUTROABS 2.4 07/30/2022  HGB 12.6 07/30/2022   HCT 36.6 07/30/2022   MCV 88.8 07/30/2022   PLT 253 07/30/2022    Imaging:  MR BRAIN W WO CONTRAST  Result Date: 08/04/2022 CLINICAL DATA:  Metastatic melanoma, follow-up EXAM: MRI HEAD WITHOUT AND WITH CONTRAST TECHNIQUE: Multiplanar, multiecho pulse sequences of the brain and surrounding structures were obtained without and with intravenous contrast. CONTRAST:  64m MULTIHANCE GADOBENATE DIMEGLUMINE 529 MG/ML IV SOLN COMPARISON:  06/11/2022 FINDINGS: Brain: Persistent focus of susceptibility and enhancement at the anterior aspect of the right putamen measuring 3 mm (series 16, image 82). No associated edema. Punctate foci of susceptibility in the left middle cerebellar peduncle and left cerebellar hemisphere (series 13, images 16 and 23) were present previously and do not demonstrate corresponding enhancement. These probably reflect chronic microhemorrhages. There is no new mass or abnormal enhancement. No acute infarction. Ventricles are stable in size. No extra-axial collection. Few scattered foci of T2 hyperintensity in the supratentorial white matter nonspecific but may reflect minor chronic microvascular ischemic  changes. Vascular: Major vessel flow voids at the skull base are preserved. Skull and upper cervical spine: Normal marrow signal is preserved. Sinuses/Orbits: Paranasal sinuses are aerated. Orbits are unremarkable. Other: Sella is unremarkable.  Mastoid air cells are clear. IMPRESSION: Stable abnormal signal in the right basal ganglia. 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 Consider additional follow-up study for confirmation. Electronically Signed   By: PMacy MisM.D.   On: 08/04/2022 14:31    [facility]in[facility] Interpretation: I have personally reviewed the radiological images as listed.  My interpretation, in the context of the patient's clinical presentation, is  metastasis vs cavernoma   Assessment/Plan Abnormal finding on MRI of brain  Frances MICUCCIis clinically stable today.  MRI brain demonstrates stable findings within right basal ganglia.  Etiology is still unclear; melanoma could theoretically be controlled via CNS effects of dual immunotherapy.  Cavernoma or tenelgectasia also remain on differential.  Will con't imaging surveillance as prior.   We appreciate the opportunity to participate in the care of Frances Bright   We ask that Frances SHAWHANreturn to clinic in 3 months following next brain MRI, or sooner as needed.  All questions were answered. The patient knows to call the clinic with any problems, questions or concerns. No barriers to learning were detected.  The total time spent in the encounter was 30 minutes and more than 50% was on counseling and review of test results   ZVentura Sellers MD Medical Director of Neuro-Oncology CNort[facility] Hospitalat W[facility] Lake08/21/23 11:31 AM

## 2022-08-06 ENCOUNTER — Encounter: Payer: Self-pay | Admitting: Hematology

## 2022-08-06 NOTE — Progress Notes (Signed)
Frances Bright   HEMATOLOGY/ONCOLOGY CLINIC VISIT NOTE  Date of Service:07/30/2022    Patient Care Team: Midge Minium, MD as PCP - General (Family Medicine) Monna Fam, MD as Consulting Physician (Ophthalmology) Lady Gary, [facility], DO as Consulting Physician  CHIEF COMPLAINTS/PURPOSE OF CONSULTATION:  Follow-up for management of metastatic melanoma  HISTORY OF PRESENTING ILLNESS:  Please see previous note for details on initial presentation  INTERVAL HISTORY  Frances Bright is here with her husband for follow-up for her malignant metastatic melanoma prior to second cycle of her palliative immunotherapy.  She notes significant pruritus after her toxicity check over her trunk and extremities with some grade 1-2 rash.  She prefers to hold off on her ipilimumab today.  She was started on standing dose Claritin and famotidine with as needed hydroxyzine and topical steroids for her pruritus and rash.  Also recommended to use Sarna lotion if needed. Labs done today were discussed in detail with her. Patient and her husband note that they are planning to move to [facility] [place] after finishing the initial 4 cycles of combination immunotherapy.    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 Dr Sharol Roussel   Anxiety    Arthritis    Atrophic vaginitis    Diverticulosis 02/2008   Enteritis, candida 2011   GERD (gastroesophageal reflux disease)    Gluten intolerance 2012   H/O bilateral breast implants    Hypothyroidism    Melanoma ([place]) 2001   back of neck   PMB (postmenopausal bleeding) 09/2012   On HRT - PUS/ SHGM with fibroids   Sleep apnea   Irritable bowel syndrome Melanoma back of neck - 2001 -- dermatologist every year -- [facility] Dr [place].  SURGICAL HISTORY: Past Surgical History:  Procedure Laterality Date    BREAST ENHANCEMENT SURGERY Bilateral 30 years ago   silicone, replaced with saline 6 years ago   [place]     over 47yr ago for abnormal pap smear   MASS EXCISION N/A 06/02/2022   Procedure: EXCISION SKIN NODULES LEFT BREAST, Left Arm,ABDOMINAL WALL, AND RIGHT NECK;  Surgeon: BCoralie Keens MD;  [place] [facility]  Service: General;  Laterality: N/A;   MELANOMA EXCISION  2001   back of neck   MYOMECTOMY  about 1987   for fibroids   UMBILICAL HERNIA REPAIR N/A 07/31/2021   Procedure: UMBILICAL HERNIA REPAIR;  Surgeon: BCoralie Keens MD;  Location[place] [facility]  Service: General;  Laterality: N/A;  LMA    SOCIAL HISTORY: Social History   Socioeconomic History   Marital status: Married    Spouse name: Not on file   Number of children: 0   Years of education: Not on file   Highest education level: Not on file  Occupational History   Not on file  Tobacco Use   Smoking status: Never   Smokeless tobacco: Never  Vaping Use   Vaping Use: Never used  Substance and Sexual Activity   Alcohol use: No   Drug use: No   Sexual activity: Not Currently    Partners: Male    Birth control/protection: Post-menopausal  Other Topics Concern   Not on file  Social History Narrative   Not on file   Social Determinants of Health   Financial Resource Strain: Not on  file  Food Insecurity: Not on file  Transportation Needs: Not on file  Physical Activity: Not on file  Stress: Not on file  Social Connections: Not on file  Intimate Partner Violence: Not on file  No cigarettes Marijuana in the past ETOH social use  FAMILY HISTORY: Family History  Problem Relation Age of Onset   Hypertension Mother    Hyperlipidemia Mother    Cancer Father   Father-- brain cancer   ALLERGIES:  is allergic to prednisone and cephalexin. Milk is diarrhea Amitriptyline-excessive somnolence Gluten Testosterone HRT cream-  MEDICATIONS:  Current Outpatient  Medications  Medication Sig Dispense Refill   famotidine (PEPCID) 20 MG tablet Take 1 tablet (20 mg total) by mouth daily. 30 tablet 2   hydrOXYzine (ATARAX) 25 MG tablet Take 1 tablet (25 mg total) by mouth 3 (three) times daily as needed. (Patient not taking: Reported on 08/04/2022) 30 tablet 0   loratadine (CLARITIN) 10 MG tablet Take 1 tablet (10 mg total) by mouth daily. 30 tablet 2   triamcinolone (KENALOG) 0.025 % ointment Apply 1 Application topically 2 (two) times daily. To rash on trunk and extremities. Do not use on face. 30 g 1   levothyroxine (SYNTHROID) 50 MCG tablet Take 0.5 tablets (25 mcg total) by mouth daily before breakfast. 90 tablet 0   liothyronine (CYTOMEL) 5 MCG tablet Take 1 tablet (5 mcg total) by mouth daily before breakfast. 180 tablet 0   ondansetron (ZOFRAN) 8 MG tablet Take 1 tablet (8 mg total) by mouth every 8 (eight) hours as needed for nausea or vomiting. (Patient not taking: Reported on 06/30/2022) 20 tablet 0   Propylene Glycol (SYSTANE BALANCE) 0.6 % SOLN Place 1 drop into both eyes in the morning and at bedtime.     No current facility-administered medications for this visit.    REVIEW OF SYSTEMS:   10 Point review of Systems was done is negative except as noted above.   PHYSICAL EXAMINATION: Telemedicine appointment  LABORATORY DATA:  I have reviewed the data as listed  .    Latest Ref Rng & Units 07/30/2022   12:09 PM 07/09/2022   11:36 AM 06/02/2022   10:57 AM  CBC  WBC 4.0 - 10.5 K/uL 4.0  3.6  3.5   Hemoglobin 12.0 - 15.0 g/dL 12.6  12.2  12.8   Hematocrit 36.0 - 46.0 % 36.6  35.9  37.4   Platelets 150 - 400 K/uL 253  193  177     .    Latest Ref Rng & Units 07/30/2022   12:09 PM 07/09/2022   11:36 AM 06/02/2022   10:57 AM  CMP  Glucose 70 - 99 mg/dL 102  113  99   BUN 8 - 23 mg/dL '19  18  20   '$ Creatinine 0.44 - 1.00 mg/dL 0.82  0.73  0.78   Sodium 135 - 145 mmol/L 138  140  139   Potassium 3.5 - 5.1 mmol/L 3.7  4.0  3.8   Chloride  98 - 111 mmol/L 108  108  102   CO2 22 - 32 mmol/L '23  27  25   '$ Calcium 8.9 - 10.3 mg/dL 9.1  9.1  9.0   Total Protein 6.5 - 8.1 g/dL 6.8  6.6    Total Bilirubin 0.3 - 1.2 mg/dL 0.8  1.5    Alkaline Phos 38 - 126 U/L 63  67    AST 15 - 41 U/L 24  16  ALT 0 - 44 U/L 29  14     . Lab Results  Component Value Date   LDH 151 07/30/2022   CYTOLOGY - NON PAP  CASE: [id number]  PATIENT: Burley Saver  Non-Gynecological Cytology Report      Clinical History: Melanoma of the neck 20 years ago, multiple new small  sub Q nodules  Specimen Submitted:  A. SUBCUTANEOUS NODULE, LEFT LATERAL THIGH, FINE  NEEDLE ASPIRATION:    FINAL MICROSCOPIC DIAGNOSIS:  - Malignant cells present  - Consistent with metastatic melanoma   RADIOGRAPHIC STUDIES: I have personally reviewed the radiological images as listed and agreed with the findings in the report. MR BRAIN W WO CONTRAST  Result Date: 08/04/2022 CLINICAL DATA:  Metastatic melanoma, follow-up EXAM: MRI HEAD WITHOUT AND WITH CONTRAST TECHNIQUE: Multiplanar, multiecho pulse sequences of the brain and surrounding structures were obtained without and with intravenous contrast. CONTRAST:  28m MULTIHANCE GADOBENATE DIMEGLUMINE 529 MG/ML IV SOLN COMPARISON:  06/11/2022 FINDINGS: Brain: Persistent focus of susceptibility and enhancement at the anterior aspect of the right putamen measuring 3 mm (series 16, image 82). No associated edema. Punctate foci of susceptibility in the left middle cerebellar peduncle and left cerebellar hemisphere (series 13, images 16 and 23) were present previously and do not demonstrate corresponding enhancement. These probably reflect chronic microhemorrhages. There is no new mass or abnormal enhancement. No acute infarction. Ventricles are stable in size. No extra-axial collection. Few scattered foci of T2 hyperintensity in the supratentorial white matter nonspecific but may reflect minor chronic microvascular ischemic  changes. Vascular: Major vessel flow voids at the skull base are preserved. Skull and upper cervical spine: Normal marrow signal is preserved. Sinuses/Orbits: Paranasal sinuses are aerated. Orbits are unremarkable. Other: Sella is unremarkable.  Mastoid air cells are clear. IMPRESSION: Stable abnormal signal in the right basal ganglia. 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 Consider additional follow-up study for confirmation. Electronically Signed   By: PMacy MisM.D.   On: 08/04/2022 14:31    ASSESSMENT & PLAN:    66year old female with  #1  Newly diagnosed metastatic melanoma presenting with right upper neck mass and numerous skin nodules over upper extremities trunk and upper proximal thigh. #2 remote history of melanoma in 2001 resected from the back #3 on multiple supplements follows with functional medicine #4 I indeterminate lung nodule Plan  labs today were discussed with her in detail. -She notes significant pruritus over her trunk and extremities with only localized rash.  On examination today she had eczematoid rash primarily from scratching. -Started on Claritin plus famotidine every day and as needed hydroxyzine for rash.  Also recommended she continue Sarna lotion and was given a prescription for triamcinolone for localized rashes.  She was recommended to use moisturizing ointment daily after shower and use only short lukewarm showers to avoid drying the skin. Triamcinolone ointment topically for rash -We will proceed with immunotherapy with pembrolizumab alone without ipilimumab today.  Follow-up The total time spent in the appointment was 30 minutes*.  All of the patient's questions were answered with apparent satisfaction. The patient knows to call the clinic with any problems, questions or concerns.   GSullivan LoneMD MS AAHIVMS [facility] Center[facility]Hematology/Oncology Physician [facility] .*Total Encounter Time as defined by  the Centers for Medicare and Medicaid Services includes, in addition to the face-to-face time of a patient visit (documented in the note above) non-face-to-face time: obtaining and reviewing outside history, ordering and reviewing  medications, tests or procedures, care coordination (communications with other health care professionals or caregivers) and documentation in the medical record.    All of the patient's questions were answered with apparent satisfaction. The patient knows to call the clinic with any problems, questions or concerns.   Sullivan Lone MD MS AAHIVMS [facility] Hematology/Oncology Physician [facility]  .*Total Encounter Time as defined by the Centers for Medicare and Medicaid Services includes, in addition to the face-to-face time of a patient visit (documented in the note above) non-face-to-face time: obtaining and reviewing outside history, ordering and reviewing medications, tests or procedures, care coordination (communications with other health care professionals or caregivers) and documentation in the medical record.

## 2022-08-07 ENCOUNTER — Other Ambulatory Visit: Payer: Self-pay | Admitting: Radiation Therapy

## 2022-08-08 ENCOUNTER — Other Ambulatory Visit: Payer: Self-pay | Admitting: Surgery

## 2022-08-19 ENCOUNTER — Other Ambulatory Visit: Payer: Self-pay

## 2022-08-19 DIAGNOSIS — C439 Malignant melanoma of skin, unspecified: Secondary | ICD-10-CM

## 2022-08-20 ENCOUNTER — Other Ambulatory Visit: Payer: Self-pay

## 2022-08-20 ENCOUNTER — Ambulatory Visit: Payer: Medicare HMO

## 2022-08-20 ENCOUNTER — Inpatient Hospital Stay: Payer: Medicare HMO

## 2022-08-20 ENCOUNTER — Encounter: Payer: Self-pay | Admitting: Hematology

## 2022-08-20 ENCOUNTER — Inpatient Hospital Stay: Payer: Medicare HMO | Attending: Hematology | Admitting: Hematology

## 2022-08-20 ENCOUNTER — Other Ambulatory Visit: Payer: Medicare HMO

## 2022-08-20 VITALS — BP 137/68 | HR 71 | Temp 97.5°F | Resp 16 | Wt 105.6 lb

## 2022-08-20 DIAGNOSIS — C439 Malignant melanoma of skin, unspecified: Secondary | ICD-10-CM

## 2022-08-20 DIAGNOSIS — Z8249 Family history of ischemic heart disease and other diseases of the circulatory system: Secondary | ICD-10-CM | POA: Diagnosis not present

## 2022-08-20 DIAGNOSIS — Z881 Allergy status to other antibiotic agents status: Secondary | ICD-10-CM | POA: Diagnosis not present

## 2022-08-20 DIAGNOSIS — Z5112 Encounter for antineoplastic immunotherapy: Secondary | ICD-10-CM | POA: Insufficient documentation

## 2022-08-20 DIAGNOSIS — C792 Secondary malignant neoplasm of skin: Secondary | ICD-10-CM | POA: Insufficient documentation

## 2022-08-20 DIAGNOSIS — Z85820 Personal history of malignant melanoma of skin: Secondary | ICD-10-CM | POA: Insufficient documentation

## 2022-08-20 DIAGNOSIS — Z808 Family history of malignant neoplasm of other organs or systems: Secondary | ICD-10-CM | POA: Insufficient documentation

## 2022-08-20 DIAGNOSIS — Z79890 Hormone replacement therapy (postmenopausal): Secondary | ICD-10-CM | POA: Diagnosis not present

## 2022-08-20 DIAGNOSIS — C434 Malignant melanoma of scalp and neck: Secondary | ICD-10-CM | POA: Insufficient documentation

## 2022-08-20 DIAGNOSIS — Z79899 Other long term (current) drug therapy: Secondary | ICD-10-CM | POA: Insufficient documentation

## 2022-08-20 DIAGNOSIS — Z888 Allergy status to other drugs, medicaments and biological substances status: Secondary | ICD-10-CM | POA: Diagnosis not present

## 2022-08-20 DIAGNOSIS — Z7189 Other specified counseling: Secondary | ICD-10-CM

## 2022-08-20 DIAGNOSIS — R21 Rash and other nonspecific skin eruption: Secondary | ICD-10-CM | POA: Diagnosis not present

## 2022-08-20 DIAGNOSIS — Z8349 Family history of other endocrine, nutritional and metabolic diseases: Secondary | ICD-10-CM | POA: Diagnosis not present

## 2022-08-20 LAB — CMP (CANCER CENTER ONLY)
ALT: 26 U/L (ref 0–44)
AST: 22 U/L (ref 15–41)
Albumin: 4.7 g/dL (ref 3.5–5.0)
Alkaline Phosphatase: 76 U/L (ref 38–126)
Anion gap: 6 (ref 5–15)
BUN: 18 mg/dL (ref 8–23)
CO2: 28 mmol/L (ref 22–32)
Calcium: 9.6 mg/dL (ref 8.9–10.3)
Chloride: 105 mmol/L (ref 98–111)
Creatinine: 0.76 mg/dL (ref 0.44–1.00)
GFR, Estimated: >60 mL/min (ref >60)
Glucose, Bld: 81 mg/dL (ref 70–99)
Potassium: 3.9 mmol/L (ref 3.5–5.1)
Sodium: 139 mmol/L (ref 135–145)
Total Bilirubin: 1.1 mg/dL (ref 0.3–1.2)
Total Protein: 7.1 g/dL (ref 6.5–8.1)

## 2022-08-20 LAB — CBC WITH DIFFERENTIAL (CANCER CENTER ONLY)
Abs Immature Granulocytes: 0.01 10*3/uL (ref 0.00–0.07)
Basophils Absolute: 0.1 10*3/uL (ref 0.0–0.1)
Basophils Relative: 1 %
Eosinophils Absolute: 0.2 10*3/uL (ref 0.0–0.5)
Eosinophils Relative: 4 %
HCT: 39.7 % (ref 36.0–46.0)
Hemoglobin: 13.7 g/dL (ref 12.0–15.0)
Immature Granulocytes: 0 %
Lymphocytes Relative: 31 %
Lymphs Abs: 1.1 10*3/uL (ref 0.7–4.0)
MCH: 30.8 pg (ref 26.0–34.0)
MCHC: 34.5 g/dL (ref 30.0–36.0)
MCV: 89.2 fL (ref 80.0–100.0)
Monocytes Absolute: 0.4 10*3/uL (ref 0.1–1.0)
Monocytes Relative: 11 %
Neutro Abs: 1.9 10*3/uL (ref 1.7–7.7)
Neutrophils Relative %: 53 %
Platelet Count: 231 10*3/uL (ref 150–400)
RBC: 4.45 MIL/uL (ref 3.87–5.11)
RDW: 12.7 % (ref 11.5–15.5)
WBC Count: 3.6 10*3/uL — ABNORMAL LOW (ref 4.0–10.5)
nRBC: 0 % (ref 0.0–0.2)

## 2022-08-20 LAB — LACTATE DEHYDROGENASE: LDH: 164 U/L (ref 98–192)

## 2022-08-20 MED ORDER — CETIRIZINE HCL 10 MG PO TABS
10.0000 mg | ORAL_TABLET | Freq: Once | ORAL | Status: AC
  Administered 2022-08-20: 10 mg via ORAL
  Filled 2022-08-20: qty 1

## 2022-08-20 MED ORDER — SODIUM CHLORIDE 0.9 % IV SOLN
2.9500 mg/kg | Freq: Once | INTRAVENOUS | Status: AC
  Administered 2022-08-20: 140 mg via INTRAVENOUS
  Filled 2022-08-20: qty 10

## 2022-08-20 MED ORDER — SODIUM CHLORIDE 0.9 % IV SOLN: Freq: Once | INTRAVENOUS | Status: AC

## 2022-08-20 NOTE — Progress Notes (Signed)
Ipilimumab (YERVOY) Patient Monitoring Assessment   Is the patient experiencing any of the following general symptoms?:  '[]'$ Difficulty performing normal activities '[]'$ Feeling sluggish or cold all the time '[]'$ Unusual weight gain '[]'$ Constant or unusual headaches '[]'$ Feeling dizzy or faint '[]'$ Changes in eyesight (blurry vision, double vision, or other vision problems) '[]'$ Changes in mood or behavior (ex: decreased sex drive, irritability, or forgetfulness) '[]'$ Starting new medications (ex: steroids, other medications that lower immune response) '[x]'$ Patient is not experiencing any of the general symptoms above.    Gastrointestinal  Patient is having 1 bowel movements each day.  Is this different from baseline? '[]'$ Yes '[x]'$ No Are your stools watery or do they have a foul smell? '[]'$ Yes '[x]'$ No Have you seen blood in your stools? '[]'$ Yes '[x]'$ No Are your stools dark, tarry, or sticky? '[]'$ Yes '[x]'$ No Are you having pain or tenderness in your belly? '[]'$ Yes '[x]'$ No  Skin Does your skin itch? '[]'$ Yes '[x]'$ No Do you have a rash? '[]'$ Yes '[x]'$ No Has your skin blistered and/or peeled? '[]'$ Yes '[x]'$ No Do you have sores in your mouth? '[]'$ Yes '[x]'$ No  Hepatic Has your urine been dark or tea colored? '[]'$ Yes '[x]'$ No Have you noticed that your skin or the whites of your eyes are turning yellow? '[]'$ Yes '[x]'$ No Are you bleeding or bruising more easily than normal? '[]'$ Yes '[x]'$ No Are you nauseous and/or vomiting? '[]'$ Yes '[x]'$ No Do you have pain on the right side of your stomach? '[]'$ Yes '[x]'$ No  Neurologic  Are you having unusual weakness of legs, arms, or face? '[]'$ Yes '[x]'$ No Are you having numbness or tingling in your hands or feet? '[]'$ Yes '[x]'$ No  Frances Bright

## 2022-08-20 NOTE — Progress Notes (Signed)
Called pt to introduce myself as her Financial Resource Specialist and to discuss the [employer] grant.  I left a msg requesting she return my call if she's interested in applying for the grant.  ?

## 2022-08-20 NOTE — Patient Instructions (Signed)
[facility] MEDICAL ONCOLOGY  Discharge Instructions: Thank you for choosing [facility] to provide your oncology and hematology care.   If you have a lab appointment with the Cancer Center, please go directly to the Cancer Center and check in at the registration area.   Wear comfortable clothing and clothing appropriate for easy access to any Portacath or PICC line.   We strive to give you quality time with your provider. You may need to reschedule your appointment if you arrive late (15 or more minutes).  Arriving late affects you and other patients whose appointments are after yours.  Also, if you miss three or more appointments without notifying the office, you may be dismissed from the clinic at the provider's discretion.      For prescription refill requests, have your pharmacy contact our office and allow 72 hours for refills to be completed.    Today you received the following chemotherapy and/or immunotherapy agents: Opdivo      To help prevent nausea and vomiting after your treatment, we encourage you to take your nausea medication as directed.  BELOW ARE SYMPTOMS THAT SHOULD BE REPORTED IMMEDIATELY: *FEVER GREATER THAN 100.4 F (38 C) OR HIGHER *CHILLS OR SWEATING *NAUSEA AND VOMITING THAT IS NOT CONTROLLED WITH YOUR NAUSEA MEDICATION *UNUSUAL SHORTNESS OF BREATH *UNUSUAL BRUISING OR BLEEDING *URINARY PROBLEMS (pain or burning when urinating, or frequent urination) *BOWEL PROBLEMS (unusual diarrhea, constipation, pain near the anus) TENDERNESS IN MOUTH AND THROAT WITH OR WITHOUT PRESENCE OF ULCERS (sore throat, sores in mouth, or a toothache) UNUSUAL RASH, SWELLING OR PAIN  UNUSUAL VAGINAL DISCHARGE OR ITCHING   Items with * indicate a potential emergency and should be followed up as soon as possible or go to the Emergency Department if any problems should occur.  Please show the CHEMOTHERAPY ALERT CARD or IMMUNOTHERAPY ALERT CARD at check-in to the  Emergency Department and triage nurse.  Should you have questions after your visit or need to cancel or reschedule your appointment, please contact [facility] MEDICAL ONCOLOGY  Dept: [phone]  and follow the prompts.  Office hours are 8:00 a.m. to 4:30 p.m. Monday - Friday. Please note that voicemails left after 4:00 p.m. may not be returned until the following business day.  We are closed weekends and major holidays. You have access to a nurse at all times for urgent questions. Please call the main number to the clinic Dept: [phone] and follow the prompts.   For any non-urgent questions, you may also contact your provider using MyChart. We now offer e-Visits for anyone 18 and older to request care online for non-urgent symptoms. For details visit [email or web address].   Also download the MyChart app! Go to the app store, search "MyChart", open the app, select [facility], and log in with your MyChart username and password.  Masks are optional in the cancer centers. If you would like for your care team to wear a mask while they are taking care of you, please let them know. You may have one support person who is at least 66 years old accompany you for your appointments. 

## 2022-08-21 DIAGNOSIS — E039 Hypothyroidism, unspecified: Secondary | ICD-10-CM | POA: Diagnosis not present

## 2022-08-25 NOTE — Progress Notes (Signed)
Marland Kitchen   HEMATOLOGY/ONCOLOGY CLINIC NOTE  Date of Service: 08/20/2022  Patient Care Team: Midge Minium, MD as PCP - General (Family Medicine) Monna Fam, MD as Consulting Physician (Ophthalmology) Lady Gary, [facility], DO as Consulting Physician  CHIEF COMPLAINTS/PURPOSE OF CONSULTATION:  Follow-up for management of metastatic melanoma  HISTORY OF PRESENTING ILLNESS:  Please see previous note for details on initial presentation  INTERVAL HISTORY Frances Bright is a 66 y.o. female here with her husband for follow-up for her malignant metastatic melanoma prior to third cycle of her palliative immunotherapy. She reports She is doing well with no new symptoms or concerns. No new skin rashes. Still with some mild pruritus. Stopped taking claritin due to sleepiness.  Labs done today were discussed in detail with her.  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 Dr Sharol Roussel   Anxiety    Arthritis    Atrophic vaginitis    Diverticulosis 02/2008   Enteritis, candida 2011   GERD (gastroesophageal reflux disease)    Gluten intolerance 2012   H/O bilateral breast implants    Hypothyroidism    Melanoma ([place]) 2001   back of neck   PMB (postmenopausal bleeding) 09/2012   On HRT - PUS/ SHGM with fibroids   Sleep apnea   Irritable bowel syndrome Melanoma back of neck - 2001 -- dermatologist every year -- [facility] Dermatology Dr [place].  SURGICAL HISTORY: Past Surgical History:  Procedure Laterality Date   BREAST ENHANCEMENT SURGERY Bilateral 30 years ago   silicone, replaced with saline 6 years ago   [place]     over 66yr ago for abnormal pap smear   MASS EXCISION N/A 06/02/2022   Procedure: EXCISION SKIN NODULES LEFT BREAST, Left Arm,ABDOMINAL WALL, AND RIGHT NECK;  Surgeon: BCoralie Keens MD;   [facility];  Service: General;  Laterality: N/A;   MELANOMA EXCISION  2001   back of neck   MYOMECTOMY  about 1987   for fibroids   UMBILICAL HERNIA REPAIR N/A 07/31/2021   Procedure: UMBILICAL HERNIA REPAIR;  Surgeon: BCoralie Keens MD;  [facility]  Service: General;  Laterality: N/A;  LMA    SOCIAL HISTORY: Social History   Socioeconomic History   Marital status: Married    Spouse name: Not on file   Number of children: 0   Years of education: Not on file   Highest education level: Not on file  Occupational History   Not on file  Tobacco Use   Smoking status: Never   Smokeless tobacco: Never  Vaping Use   Vaping Use: Never used  Substance and Sexual Activity   Alcohol use: No   Drug use: No   Sexual activity: Not Currently    Partners: Male    Birth control/protection: Post-menopausal  Other Topics Concern   Not on file  Social History Narrative   Not on file   Social Determinants of Health   Financial Resource Strain: Not on file  Food Insecurity: Not on file  Transportation Needs: Not on file  Physical Activity: Not on file  Stress: Not on file  Social Connections: Not on file  Intimate Partner Violence: Not on file  No cigarettes Marijuana in the past ETOH social use  FAMILY HISTORY: Family History  Problem Relation Age of  Onset   Hypertension Mother    Hyperlipidemia Mother    Cancer Father   Father-- brain cancer   ALLERGIES:  is allergic to prednisone and cephalexin. Milk is diarrhea Amitriptyline-excessive somnolence Gluten Testosterone HRT cream-  MEDICATIONS:  Current Outpatient Medications  Medication Sig Dispense Refill   famotidine (PEPCID) 20 MG tablet Take 1 tablet (20 mg total) by mouth daily. 30 tablet 2   hydrOXYzine (ATARAX) 25 MG tablet Take 1 tablet (25 mg total) by mouth 3 (three) times daily as needed. (Patient not taking: Reported on 08/04/2022) 30 tablet 0   levothyroxine (SYNTHROID) 50  MCG tablet Take 0.5 tablets (25 mcg total) by mouth daily before breakfast. 90 tablet 0   liothyronine (CYTOMEL) 5 MCG tablet Take 1 tablet (5 mcg total) by mouth daily before breakfast. 180 tablet 0   loratadine (CLARITIN) 10 MG tablet Take 1 tablet (10 mg total) by mouth daily. 30 tablet 2   ondansetron (ZOFRAN) 8 MG tablet Take 1 tablet (8 mg total) by mouth every 8 (eight) hours as needed for nausea or vomiting. (Patient not taking: Reported on 06/30/2022) 20 tablet 0   Propylene Glycol (SYSTANE BALANCE) 0.6 % SOLN Place 1 drop into both eyes in the morning and at bedtime.     triamcinolone (KENALOG) 0.025 % ointment Apply 1 Application topically 2 (two) times daily. To rash on trunk and extremities. Do not use on face. 30 g 1   No current facility-administered medications for this visit.    REVIEW OF SYSTEMS:   10 Point review of Systems was done is negative except as noted above.   PHYSICAL EXAMINATION: Vitals:   08/20/22 1138  BP: 137/68  Pulse: 71  Resp: 16  Temp: (!) 97.5 F (36.4 C)  SpO2: 98%   NAD GENERAL:alert, in no acute distress and comfortable SKIN: no acute rashes, no significant lesions EYES: conjunctiva are pink and non-injected, sclera anicteric NECK: supple, no JVD LYMPH:  no palpable lymphadenopathy in the cervical, axillary or inguinal regions LUNGS: clear to auscultation b/l with normal respiratory effort HEART: regular rate & rhythm ABDOMEN:  normoactive bowel sounds , non tender, not distended. Extremity: no pedal edema PSYCH: alert & oriented x 3 with fluent speech NEURO: no focal motor/sensory deficits  LABORATORY DATA:  I have reviewed the data as listed  .    Latest Ref Rng & Units 08/20/2022   11:19 AM 07/30/2022   12:09 PM 07/09/2022   11:36 AM  CBC  WBC 4.0 - 10.5 K/uL 3.6  4.0  3.6   Hemoglobin 12.0 - 15.0 g/dL 13.7  12.6  12.2   Hematocrit 36.0 - 46.0 % 39.7  36.6  35.9   Platelets 150 - 400 K/uL 231  253  193     .    Latest Ref  Rng & Units 08/20/2022   11:19 AM 07/30/2022   12:09 PM 07/09/2022   11:36 AM  CMP  Glucose 70 - 99 mg/dL 81  102  113   BUN 8 - 23 mg/dL '18  19  18   '$ Creatinine 0.44 - 1.00 mg/dL 0.76  0.82  0.73   Sodium 135 - 145 mmol/L 139  138  140   Potassium 3.5 - 5.1 mmol/L 3.9  3.7  4.0   Chloride 98 - 111 mmol/L 105  108  108   CO2 22 - 32 mmol/L '28  23  27   '$ Calcium 8.9 - 10.3 mg/dL 9.6  9.1  9.1  Total Protein 6.5 - 8.1 g/dL 7.1  6.8  6.6   Total Bilirubin 0.3 - 1.2 mg/dL 1.1  0.8  1.5   Alkaline Phos 38 - 126 U/L 76  63  67   AST 15 - 41 U/L '22  24  16   '$ ALT 0 - 44 U/L '26  29  14    '$ . Lab Results  Component Value Date   LDH 164 08/20/2022   CYTOLOGY - NON PAP  CASE: [id number]  PATIENT: Frances Bright  Non-Gynecological Cytology Report      Clinical History: Melanoma of the neck 20 years ago, multiple new small  sub Q nodules  Specimen Submitted:  A. SUBCUTANEOUS NODULE, LEFT LATERAL THIGH, FINE  NEEDLE ASPIRATION:    FINAL MICROSCOPIC DIAGNOSIS:  - Malignant cells present  - Consistent with metastatic melanoma   RADIOGRAPHIC STUDIES: I have personally reviewed the radiological images as listed and agreed with the findings in the report. MR BRAIN W WO CONTRAST  Result Date: 08/04/2022 CLINICAL DATA:  Metastatic melanoma, follow-up EXAM: MRI HEAD WITHOUT AND WITH CONTRAST TECHNIQUE: Multiplanar, multiecho pulse sequences of the brain and surrounding structures were obtained without and with intravenous contrast. CONTRAST:  57m MULTIHANCE GADOBENATE DIMEGLUMINE 529 MG/ML IV SOLN COMPARISON:  06/11/2022 FINDINGS: Brain: Persistent focus of susceptibility and enhancement at the anterior aspect of the right putamen measuring 3 mm (series 16, image 82). No associated edema. Punctate foci of susceptibility in the left middle cerebellar peduncle and left cerebellar hemisphere (series 13, images 16 and 23) were present previously and do not demonstrate corresponding enhancement.  These probably reflect chronic microhemorrhages. There is no new mass or abnormal enhancement. No acute infarction. Ventricles are stable in size. No extra-axial collection. Few scattered foci of T2 hyperintensity in the supratentorial white matter nonspecific but may reflect minor chronic microvascular ischemic changes. Vascular: Major vessel flow voids at the skull base are preserved. Skull and upper cervical spine: Normal marrow signal is preserved. Sinuses/Orbits: Paranasal sinuses are aerated. Orbits are unremarkable. Other: Sella is unremarkable.  Mastoid air cells are clear. IMPRESSION: Stable abnormal signal in the right basal ganglia. 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 Consider additional follow-up study for confirmation. Electronically Signed   By: PMacy MisM.D.   On: 08/04/2022 14:31    ASSESSMENT & PLAN:    66year old female with  #1  Newly diagnosed metastatic melanoma presenting with right upper neck mass and numerous skin nodules over upper extremities trunk and upper proximal thigh. #2 remote history of melanoma in 2001 resected from the back #3 on multiple supplements follows with functional medicine #4 I indeterminate lung nodule Plan -labs today were discussed with her in detail. CBC WNL except WBC count of 3.6k.  CMP unremarkable. Intermittent itching without rash. -patient prefer to continue to hold Ipikimumab and continue monotherapy with Nivolumab every 2 weeks. -new treatment orders for Nivolumab placed. No clinical evidence of melanoma progression at this time. Rpt MRI brain reviewed. -will plan to rpt PET/CT after 5-6 cycles of treatment.  Follow-up Continue Nivolumab every 2 weeks with labs and MD visit. [place] [facility]  .The total time spent in the appointment was 31 minutes* .  All  of the patient's questions were answered with apparent satisfaction. The patient knows to call the clinic with any problems, questions or concerns.  Sullivan Lone MD MS AAHIVMS [facility] Hematology/Oncology Physician [facility]  .*Total Encounter Time as defined by the Centers for Medicare and Medicaid Services includes, in addition to the face-to-face time of a patient visit (documented in the note above) non-face-to-face time: obtaining and reviewing outside history, ordering and reviewing medications, tests or procedures, care coordination (communications with other health care professionals or caregivers) and documentation in the medical record.  I, Melene Muller, am acting as scribe for Dr. Sullivan Lone, MD. .I have reviewed the above documentation for accuracy and completeness, and I agree with the above. [name] MD

## 2022-08-27 ENCOUNTER — Encounter: Payer: Self-pay | Admitting: Hematology

## 2022-09-03 ENCOUNTER — Inpatient Hospital Stay: Payer: Medicare HMO

## 2022-09-03 ENCOUNTER — Inpatient Hospital Stay: Payer: Medicare HMO | Admitting: Hematology

## 2022-09-03 ENCOUNTER — Other Ambulatory Visit: Payer: Self-pay

## 2022-09-03 VITALS — BP 137/67 | HR 74 | Temp 97.8°F | Resp 15 | Ht 66.0 in | Wt 105.4 lb

## 2022-09-03 DIAGNOSIS — C792 Secondary malignant neoplasm of skin: Secondary | ICD-10-CM | POA: Diagnosis not present

## 2022-09-03 DIAGNOSIS — Z888 Allergy status to other drugs, medicaments and biological substances status: Secondary | ICD-10-CM | POA: Diagnosis not present

## 2022-09-03 DIAGNOSIS — C439 Malignant melanoma of skin, unspecified: Secondary | ICD-10-CM | POA: Diagnosis not present

## 2022-09-03 DIAGNOSIS — Z808 Family history of malignant neoplasm of other organs or systems: Secondary | ICD-10-CM | POA: Diagnosis not present

## 2022-09-03 DIAGNOSIS — Z8249 Family history of ischemic heart disease and other diseases of the circulatory system: Secondary | ICD-10-CM | POA: Diagnosis not present

## 2022-09-03 DIAGNOSIS — Z5112 Encounter for antineoplastic immunotherapy: Secondary | ICD-10-CM

## 2022-09-03 DIAGNOSIS — Z7189 Other specified counseling: Secondary | ICD-10-CM

## 2022-09-03 DIAGNOSIS — R21 Rash and other nonspecific skin eruption: Secondary | ICD-10-CM | POA: Diagnosis not present

## 2022-09-03 DIAGNOSIS — C434 Malignant melanoma of scalp and neck: Secondary | ICD-10-CM | POA: Diagnosis not present

## 2022-09-03 DIAGNOSIS — Z8349 Family history of other endocrine, nutritional and metabolic diseases: Secondary | ICD-10-CM | POA: Diagnosis not present

## 2022-09-03 DIAGNOSIS — Z881 Allergy status to other antibiotic agents status: Secondary | ICD-10-CM | POA: Diagnosis not present

## 2022-09-03 DIAGNOSIS — Z79899 Other long term (current) drug therapy: Secondary | ICD-10-CM | POA: Diagnosis not present

## 2022-09-03 DIAGNOSIS — Z85820 Personal history of malignant melanoma of skin: Secondary | ICD-10-CM | POA: Diagnosis not present

## 2022-09-03 DIAGNOSIS — Z79890 Hormone replacement therapy (postmenopausal): Secondary | ICD-10-CM | POA: Diagnosis not present

## 2022-09-03 LAB — CBC WITH DIFFERENTIAL (CANCER CENTER ONLY)
Abs Immature Granulocytes: 0 10*3/uL (ref 0.00–0.07)
Basophils Absolute: 0 10*3/uL (ref 0.0–0.1)
Basophils Relative: 1 %
Eosinophils Absolute: 0.1 10*3/uL (ref 0.0–0.5)
Eosinophils Relative: 3 %
HCT: 39.4 % (ref 36.0–46.0)
Hemoglobin: 13.4 g/dL (ref 12.0–15.0)
Immature Granulocytes: 0 %
Lymphocytes Relative: 34 %
Lymphs Abs: 1.2 10*3/uL (ref 0.7–4.0)
MCH: 30.5 pg (ref 26.0–34.0)
MCHC: 34 g/dL (ref 30.0–36.0)
MCV: 89.5 fL (ref 80.0–100.0)
Monocytes Absolute: 0.3 10*3/uL (ref 0.1–1.0)
Monocytes Relative: 8 %
Neutro Abs: 1.8 10*3/uL (ref 1.7–7.7)
Neutrophils Relative %: 54 %
Platelet Count: 219 10*3/uL (ref 150–400)
RBC: 4.4 MIL/uL (ref 3.87–5.11)
RDW: 12.4 % (ref 11.5–15.5)
WBC Count: 3.4 10*3/uL — ABNORMAL LOW (ref 4.0–10.5)
nRBC: 0 % (ref 0.0–0.2)

## 2022-09-03 LAB — CMP (CANCER CENTER ONLY)
ALT: 20 U/L (ref 0–44)
AST: 20 U/L (ref 15–41)
Albumin: 4.5 g/dL (ref 3.5–5.0)
Alkaline Phosphatase: 77 U/L (ref 38–126)
Anion gap: 5 (ref 5–15)
BUN: 21 mg/dL (ref 8–23)
CO2: 30 mmol/L (ref 22–32)
Calcium: 9.1 mg/dL (ref 8.9–10.3)
Chloride: 105 mmol/L (ref 98–111)
Creatinine: 0.75 mg/dL (ref 0.44–1.00)
GFR, Estimated: >60 mL/min (ref >60)
Glucose, Bld: 99 mg/dL (ref 70–99)
Potassium: 4 mmol/L (ref 3.5–5.1)
Sodium: 140 mmol/L (ref 135–145)
Total Bilirubin: 1.1 mg/dL (ref 0.3–1.2)
Total Protein: 6.7 g/dL (ref 6.5–8.1)

## 2022-09-03 LAB — LACTATE DEHYDROGENASE: LDH: 159 U/L (ref 98–192)

## 2022-09-03 MED ORDER — SODIUM CHLORIDE 0.9 % IV SOLN: Freq: Once | INTRAVENOUS | Status: AC

## 2022-09-03 MED ORDER — SODIUM CHLORIDE 0.9 % IV SOLN
140.0000 mg | Freq: Once | INTRAVENOUS | Status: AC
  Administered 2022-09-03: 140 mg via INTRAVENOUS
  Filled 2022-09-03: qty 10

## 2022-09-03 MED ORDER — CETIRIZINE HCL 10 MG PO TABS
10.0000 mg | ORAL_TABLET | Freq: Every day | ORAL | Status: DC
  Administered 2022-09-03: 10 mg via ORAL
  Filled 2022-09-03: qty 1

## 2022-09-03 NOTE — Patient Instructions (Signed)
[facility] MEDICAL ONCOLOGY  Discharge Instructions: Thank you for choosing [facility] to provide your oncology and hematology care.   If you have a lab appointment with the Cancer Center, please go directly to the Cancer Center and check in at the registration area.   Wear comfortable clothing and clothing appropriate for easy access to any Portacath or PICC line.   We strive to give you quality time with your provider. You may need to reschedule your appointment if you arrive late (15 or more minutes).  Arriving late affects you and other patients whose appointments are after yours.  Also, if you miss three or more appointments without notifying the office, you may be dismissed from the clinic at the provider's discretion.      For prescription refill requests, have your pharmacy contact our office and allow 72 hours for refills to be completed.    Today you received the following chemotherapy and/or immunotherapy agents: Opdivo      To help prevent nausea and vomiting after your treatment, we encourage you to take your nausea medication as directed.  BELOW ARE SYMPTOMS THAT SHOULD BE REPORTED IMMEDIATELY: *FEVER GREATER THAN 100.4 F (38 C) OR HIGHER *CHILLS OR SWEATING *NAUSEA AND VOMITING THAT IS NOT CONTROLLED WITH YOUR NAUSEA MEDICATION *UNUSUAL SHORTNESS OF BREATH *UNUSUAL BRUISING OR BLEEDING *URINARY PROBLEMS (pain or burning when urinating, or frequent urination) *BOWEL PROBLEMS (unusual diarrhea, constipation, pain near the anus) TENDERNESS IN MOUTH AND THROAT WITH OR WITHOUT PRESENCE OF ULCERS (sore throat, sores in mouth, or a toothache) UNUSUAL RASH, SWELLING OR PAIN  UNUSUAL VAGINAL DISCHARGE OR ITCHING   Items with * indicate a potential emergency and should be followed up as soon as possible or go to the Emergency Department if any problems should occur.  Please show the CHEMOTHERAPY ALERT CARD or IMMUNOTHERAPY ALERT CARD at check-in to the  Emergency Department and triage nurse.  Should you have questions after your visit or need to cancel or reschedule your appointment, please contact [facility] MEDICAL ONCOLOGY  Dept: [phone]  and follow the prompts.  Office hours are 8:00 a.m. to 4:30 p.m. Monday - Friday. Please note that voicemails left after 4:00 p.m. may not be returned until the following business day.  We are closed weekends and major holidays. You have access to a nurse at all times for urgent questions. Please call the main number to the clinic Dept: [phone] and follow the prompts.   For any non-urgent questions, you may also contact your provider using MyChart. We now offer e-Visits for anyone 18 and older to request care online for non-urgent symptoms. For details visit [email or web address].   Also download the MyChart app! Go to the app store, search "MyChart", open the app, select [facility], and log in with your MyChart username and password.  Masks are optional in the cancer centers. If you would like for your care team to wear a mask while they are taking care of you, please let them know. You may have one support person who is at least 66 years old accompany you for your appointments. 

## 2022-09-05 DIAGNOSIS — L430 Hypertrophic lichen planus: Secondary | ICD-10-CM | POA: Diagnosis not present

## 2022-09-05 DIAGNOSIS — D485 Neoplasm of uncertain behavior of skin: Secondary | ICD-10-CM | POA: Diagnosis not present

## 2022-09-05 DIAGNOSIS — L820 Inflamed seborrheic keratosis: Secondary | ICD-10-CM | POA: Diagnosis not present

## 2022-09-05 DIAGNOSIS — L821 Other seborrheic keratosis: Secondary | ICD-10-CM | POA: Diagnosis not present

## 2022-09-05 DIAGNOSIS — Z85820 Personal history of malignant melanoma of skin: Secondary | ICD-10-CM | POA: Diagnosis not present

## 2022-09-08 DIAGNOSIS — I951 Orthostatic hypotension: Secondary | ICD-10-CM | POA: Insufficient documentation

## 2022-09-08 DIAGNOSIS — I83813 Varicose veins of bilateral lower extremities with pain: Secondary | ICD-10-CM | POA: Insufficient documentation

## 2022-09-08 NOTE — Assessment & Plan Note (Signed)
New.  Pt reports she has hx of sclerotherapy but this was not successful.  Now having leg throbbing bilaterally.  No hx of clots but this is a concern for her.  Will get Ddimer.  Refer to vascular surgery.  Again discussed compression socks for symptom relief.

## 2022-09-08 NOTE — Assessment & Plan Note (Signed)
Check labs and replete prn. 

## 2022-09-08 NOTE — Assessment & Plan Note (Signed)
New to provider, ongoing for pt.  Currently on Armour '30mg'$  daily.  Check labs.  Adjust meds prn

## 2022-09-08 NOTE — Assessment & Plan Note (Signed)
New to provider, ongoing for pt.  Encouraged increased salt and water intake.  Discussed compression socks and changing positions slowly.  Will continue to follow.

## 2022-09-10 ENCOUNTER — Encounter: Payer: Self-pay | Admitting: Hematology

## 2022-09-10 ENCOUNTER — Ambulatory Visit: Payer: Medicare HMO

## 2022-09-10 ENCOUNTER — Other Ambulatory Visit: Payer: Medicare HMO

## 2022-09-10 ENCOUNTER — Ambulatory Visit: Payer: Medicare HMO | Admitting: Hematology

## 2022-09-10 NOTE — Progress Notes (Signed)
Marland Kitchen   HEMATOLOGY/ONCOLOGY CLINIC NOTE  Date of Service:09/03/2022   Patient Care Team: Midge Minium, MD as PCP - General (Family Medicine) Monna Fam, MD as Consulting Physician (Ophthalmology) Lady Gary, [facility], DO as Consulting Physician  CHIEF COMPLAINTS/PURPOSE OF CONSULTATION:  Follow-up for continued evaluation and management of metastatic melanoma  HISTORY OF PRESENTING ILLNESS:  Please see previous note for details on initial presentation  INTERVAL HISTORY [name] is a 66 y.o. female who is here for continued valuation and management of her metastatic melanoma. She notes that her skin nodules are getting initiated and some have disappeared. She notes no acute new focal symptoms. She has some grade 1-2 pruritus and minimal grade 1 rash on her chest and trunk for which she is using topical steroids with good effects. No diarrhea no other acute new toxicities from her current immunotherapy with nivolumab. She is in the process of moving to [place] and has been traveling for this. Labs done today were discussed in detail with the patient.  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 Dr Sharol Roussel   Anxiety    Arthritis    Atrophic vaginitis    Diverticulosis 02/2008   Enteritis, candida 2011   GERD (gastroesophageal reflux disease)    Gluten intolerance 2012   H/O bilateral breast implants    Hypothyroidism    Melanoma ([place]) 2001   back of neck   PMB (postmenopausal bleeding) 09/2012   On HRT - PUS/ SHGM with fibroids   Sleep apnea   Irritable bowel syndrome Melanoma back of neck - 2001 -- dermatologist every year -- [facility] Dermatology Dr [place].  SURGICAL HISTORY: Past Surgical History:  Procedure Laterality Date   BREAST ENHANCEMENT SURGERY Bilateral 30 years ago   silicone, replaced with  saline 6 years ago   [place]     over 69yr ago for abnormal pap smear   MASS EXCISION N/A 06/02/2022   Procedure: EXCISION SKIN NODULES LEFT BREAST, Left Arm,ABDOMINAL WALL, AND RIGHT NECK;  Surgeon: BCoralie Keens MD;  [facility];  Service: General;  Laterality: N/A;   MELANOMA EXCISION  2001   back of neck   MYOMECTOMY  about 1987   for fibroids   UMBILICAL HERNIA REPAIR N/A 07/31/2021   Procedure: UMBILICAL HERNIA REPAIR;  Surgeon: BCoralie Keens MD;  [facility]  Service: General;  Laterality: N/A;  LMA    SOCIAL HISTORY: Social History   Socioeconomic History   Marital status: Married    Spouse name: Not on file   Number of children: 0   Years of education: Not on file   Highest education level: Not on file  Occupational History   Not on file  Tobacco Use   Smoking status: Never   Smokeless tobacco: Never  Vaping Use   Vaping Use: Never used  Substance and Sexual Activity   Alcohol use: No   Drug use: No   Sexual activity: Not Currently    Partners: Male    Birth control/protection: Post-menopausal  Other Topics Concern   Not on file  Social History Narrative   Not on file   Social Determinants of Health   Financial Resource Strain: Not on file  Food Insecurity: Not on file  Transportation Needs: Not on file  Physical Activity: Not on file  Stress: Not on file  Social Connections: Not on file  Intimate Partner Violence: Not on file  No cigarettes Marijuana in the past ETOH social use  FAMILY HISTORY: Family History  Problem Relation Age of Onset   Hypertension Mother    Hyperlipidemia Mother    Cancer Father   Father-- brain cancer   ALLERGIES:  is allergic to prednisone and cephalexin. Milk is diarrhea Amitriptyline-excessive somnolence Gluten Testosterone HRT cream-  MEDICATIONS:  Current Outpatient Medications  Medication Sig Dispense Refill   famotidine (PEPCID) 20 MG tablet  Take 1 tablet (20 mg total) by mouth daily. 30 tablet 2   hydrOXYzine (ATARAX) 25 MG tablet Take 1 tablet (25 mg total) by mouth 3 (three) times daily as needed. (Patient not taking: Reported on 08/04/2022) 30 tablet 0   levothyroxine (SYNTHROID) 50 MCG tablet Take 0.5 tablets (25 mcg total) by mouth daily before breakfast. 90 tablet 0   liothyronine (CYTOMEL) 5 MCG tablet Take 1 tablet (5 mcg total) by mouth daily before breakfast. 180 tablet 0   loratadine (CLARITIN) 10 MG tablet Take 1 tablet (10 mg total) by mouth daily. 30 tablet 2   ondansetron (ZOFRAN) 8 MG tablet Take 1 tablet (8 mg total) by mouth every 8 (eight) hours as needed for nausea or vomiting. (Patient not taking: Reported on 06/30/2022) 20 tablet 0   Propylene Glycol (SYSTANE BALANCE) 0.6 % SOLN Place 1 drop into both eyes in the morning and at bedtime.     No current facility-administered medications for this visit.    REVIEW OF SYSTEMS:   10 Point review of Systems was done is negative except as noted above.  PHYSICAL EXAMINATION: Vitals:   09/03/22 1138  BP: 137/67  Pulse: 74  Resp: 15  Temp: 97.8 F (36.6 C)  SpO2: 100%   NAD GENERAL:alert, in no acute distress and comfortable SKIN: no acute rashes, no significant lesions EYES: conjunctiva are pink and non-injected, sclera anicteric OROPHARYNX: MMM, no exudates, no oropharyngeal erythema or ulceration NECK: supple, no JVD LYMPH:  no palpable lymphadenopathy in the cervical, axillary or inguinal regions LUNGS: clear to auscultation b/l with normal respiratory effort HEART: regular rate & rhythm ABDOMEN:  normoactive bowel sounds , non tender, not distended. Extremity: no pedal edema PSYCH: alert & oriented x 3 with fluent speech NEURO: no focal motor/sensory deficits   LABORATORY DATA:  I have reviewed the data as listed  .    Latest Ref Rng & Units 09/03/2022   11:13 AM 08/20/2022   11:19 AM 07/30/2022   12:09 PM  CBC  WBC 4.0 - 10.5 K/uL 3.4  3.6   4.0   Hemoglobin 12.0 - 15.0 g/dL 13.4  13.7  12.6   Hematocrit 36.0 - 46.0 % 39.4  39.7  36.6   Platelets 150 - 400 K/uL 219  231  253     .    Latest Ref Rng & Units 09/03/2022   11:13 AM 08/20/2022   11:19 AM 07/30/2022   12:09 PM  CMP  Glucose 70 - 99 mg/dL 99  81  102   BUN 8 - 23 mg/dL '21  18  19   '$ Creatinine 0.44 - 1.00 mg/dL 0.75  0.76  0.82   Sodium 135 - 145 mmol/L 140  139  138   Potassium 3.5 - 5.1 mmol/L 4.0  3.9  3.7   Chloride 98 - 111 mmol/L 105  105  108   CO2  22 - 32 mmol/L '30  28  23   '$ Calcium 8.9 - 10.3 mg/dL 9.1  9.6  9.1   Total Protein 6.5 - 8.1 g/dL 6.7  7.1  6.8   Total Bilirubin 0.3 - 1.2 mg/dL 1.1  1.1  0.8   Alkaline Phos 38 - 126 U/L 77  76  63   AST 15 - 41 U/L '20  22  24   '$ ALT 0 - 44 U/L '20  26  29    '$ . Lab Results  Component Value Date   LDH 159 09/03/2022   CYTOLOGY - NON PAP  CASE: [id number]  PATIENT: Burley Saver  Non-Gynecological Cytology Report      Clinical History: Melanoma of the neck 20 years ago, multiple new small  sub Q nodules  Specimen Submitted:  A. SUBCUTANEOUS NODULE, LEFT LATERAL THIGH, FINE  NEEDLE ASPIRATION:    FINAL MICROSCOPIC DIAGNOSIS:  - Malignant cells present  - Consistent with metastatic melanoma   RADIOGRAPHIC STUDIES: I have personally reviewed the radiological images as listed and agreed with the findings in the report. No results found.  ASSESSMENT & PLAN:    66 year old female with  #1  Recently diagnosed metastatic melanoma presenting with right upper neck mass and numerous skin nodules over upper extremities trunk and upper proximal thigh. #2 remote history of melanoma in 2001 resected from the back #3 on multiple supplements follows with functional medicine #4 I indeterminate lung nodule Plan -labs today were discussed with her in detail. CBC WNL except WBC count of 3.6k.  CMP unremarkable. Intermittent itching without rash.  And is using Sarna lotion or topical over the counter  steroid ointments for itching-rash. -patient prefer to continue to hold Ipikimumab and continue monotherapy with Nivolumab every 2 weeks. No clinical evidence of melanoma progression at this time She has a repeat MRI of the brain scheduled with Dr. Mickeal Skinner on 10/17/2022. -We will plan to get a repeat PET CT scan after 5-6 cycles of her current chemotherapy. Rpt MRI brain reviewed.  Patient disease -will plan to rpt PET/CT after 5-6 cycles of treatment.  Follow-up Continue Nivolumab every 2 weeks with labs and MD visit. Patient is intending to move to [place] and we shall need to transfer her care to [facility] oncology team in melanomas.  The total time spent in the appointment was 30 minutes*.  All of the patient's questions were answered with apparent satisfaction. The patient knows to call the clinic with any problems, questions or concerns.   Sullivan Lone MD MS AAHIVMS [facility] Hematology/Oncology Physician [facility]  .*Total Encounter Time as defined by the Centers for Medicare and Medicaid Services includes, in addition to the face-to-face time of a patient visit (documented in the note above) non-face-to-face time: obtaining and reviewing outside history, ordering and reviewing medications, tests or procedures, care coordination (communications with other health care professionals or caregivers) and documentation in the medical record.

## 2022-09-11 ENCOUNTER — Other Ambulatory Visit: Payer: Self-pay | Admitting: Hematology

## 2022-09-11 DIAGNOSIS — C439 Malignant melanoma of skin, unspecified: Secondary | ICD-10-CM

## 2022-09-11 DIAGNOSIS — Z7189 Other specified counseling: Secondary | ICD-10-CM

## 2022-09-11 NOTE — Progress Notes (Signed)
Nivolumab orders placed as per updated [facility] order set

## 2022-09-23 ENCOUNTER — Inpatient Hospital Stay: Payer: Medicare HMO | Admitting: Hematology

## 2022-09-23 ENCOUNTER — Other Ambulatory Visit: Payer: Self-pay

## 2022-09-23 ENCOUNTER — Inpatient Hospital Stay: Payer: Medicare HMO | Attending: Hematology

## 2022-09-23 ENCOUNTER — Inpatient Hospital Stay: Payer: Medicare HMO

## 2022-09-23 VITALS — BP 126/89 | HR 72 | Temp 97.9°F | Resp 15 | Ht 66.0 in | Wt 106.7 lb

## 2022-09-23 VITALS — BP 134/75 | HR 65 | Temp 97.7°F | Resp 18

## 2022-09-23 DIAGNOSIS — Z8349 Family history of other endocrine, nutritional and metabolic diseases: Secondary | ICD-10-CM | POA: Diagnosis not present

## 2022-09-23 DIAGNOSIS — C434 Malignant melanoma of scalp and neck: Secondary | ICD-10-CM | POA: Insufficient documentation

## 2022-09-23 DIAGNOSIS — Z8249 Family history of ischemic heart disease and other diseases of the circulatory system: Secondary | ICD-10-CM | POA: Diagnosis not present

## 2022-09-23 DIAGNOSIS — Z881 Allergy status to other antibiotic agents status: Secondary | ICD-10-CM | POA: Insufficient documentation

## 2022-09-23 DIAGNOSIS — Z5112 Encounter for antineoplastic immunotherapy: Secondary | ICD-10-CM | POA: Insufficient documentation

## 2022-09-23 DIAGNOSIS — C439 Malignant melanoma of skin, unspecified: Secondary | ICD-10-CM

## 2022-09-23 DIAGNOSIS — C792 Secondary malignant neoplasm of skin: Secondary | ICD-10-CM | POA: Diagnosis not present

## 2022-09-23 DIAGNOSIS — E039 Hypothyroidism, unspecified: Secondary | ICD-10-CM | POA: Diagnosis not present

## 2022-09-23 DIAGNOSIS — Z79890 Hormone replacement therapy (postmenopausal): Secondary | ICD-10-CM | POA: Diagnosis not present

## 2022-09-23 DIAGNOSIS — Z7189 Other specified counseling: Secondary | ICD-10-CM

## 2022-09-23 DIAGNOSIS — Z808 Family history of malignant neoplasm of other organs or systems: Secondary | ICD-10-CM | POA: Insufficient documentation

## 2022-09-23 DIAGNOSIS — Z888 Allergy status to other drugs, medicaments and biological substances status: Secondary | ICD-10-CM | POA: Insufficient documentation

## 2022-09-23 DIAGNOSIS — Z79899 Other long term (current) drug therapy: Secondary | ICD-10-CM | POA: Diagnosis not present

## 2022-09-23 DIAGNOSIS — Z809 Family history of malignant neoplasm, unspecified: Secondary | ICD-10-CM | POA: Insufficient documentation

## 2022-09-23 LAB — CBC WITH DIFFERENTIAL (CANCER CENTER ONLY)
Abs Immature Granulocytes: 0 10*3/uL (ref 0.00–0.07)
Basophils Absolute: 0 10*3/uL (ref 0.0–0.1)
Basophils Relative: 1 %
Eosinophils Absolute: 0.2 10*3/uL (ref 0.0–0.5)
Eosinophils Relative: 5 %
HCT: 37.7 % (ref 36.0–46.0)
Hemoglobin: 12.9 g/dL (ref 12.0–15.0)
Immature Granulocytes: 0 %
Lymphocytes Relative: 30 %
Lymphs Abs: 1.1 10*3/uL (ref 0.7–4.0)
MCH: 30.3 pg (ref 26.0–34.0)
MCHC: 34.2 g/dL (ref 30.0–36.0)
MCV: 88.5 fL (ref 80.0–100.0)
Monocytes Absolute: 0.4 10*3/uL (ref 0.1–1.0)
Monocytes Relative: 12 %
Neutro Abs: 1.9 10*3/uL (ref 1.7–7.7)
Neutrophils Relative %: 52 %
Platelet Count: 221 10*3/uL (ref 150–400)
RBC: 4.26 MIL/uL (ref 3.87–5.11)
RDW: 12.3 % (ref 11.5–15.5)
WBC Count: 3.6 10*3/uL — ABNORMAL LOW (ref 4.0–10.5)
nRBC: 0 % (ref 0.0–0.2)

## 2022-09-23 LAB — CMP (CANCER CENTER ONLY)
ALT: 28 U/L (ref 0–44)
AST: 22 U/L (ref 15–41)
Albumin: 4.4 g/dL (ref 3.5–5.0)
Alkaline Phosphatase: 77 U/L (ref 38–126)
Anion gap: 5 (ref 5–15)
BUN: 17 mg/dL (ref 8–23)
CO2: 29 mmol/L (ref 22–32)
Calcium: 9 mg/dL (ref 8.9–10.3)
Chloride: 106 mmol/L (ref 98–111)
Creatinine: 0.72 mg/dL (ref 0.44–1.00)
GFR, Estimated: >60 mL/min (ref >60)
Glucose, Bld: 105 mg/dL — ABNORMAL HIGH (ref 70–99)
Potassium: 3.9 mmol/L (ref 3.5–5.1)
Sodium: 140 mmol/L (ref 135–145)
Total Bilirubin: 1.1 mg/dL (ref 0.3–1.2)
Total Protein: 6.8 g/dL (ref 6.5–8.1)

## 2022-09-23 LAB — TSH: TSH: 2.539 u[IU]/mL (ref 0.350–4.500)

## 2022-09-23 MED ORDER — SODIUM CHLORIDE 0.9 % IV SOLN
240.0000 mg | Freq: Once | INTRAVENOUS | Status: AC
  Administered 2022-09-23: 240 mg via INTRAVENOUS
  Filled 2022-09-23: qty 24

## 2022-09-23 MED ORDER — SODIUM CHLORIDE 0.9 % IV SOLN: Freq: Once | INTRAVENOUS | Status: AC

## 2022-09-23 NOTE — Patient Instructions (Signed)
[facility] MEDICAL ONCOLOGY  Discharge Instructions: Thank you for choosing [facility] to provide your oncology and hematology care.   If you have a lab appointment with the Cancer Center, please go directly to the Cancer Center and check in at the registration area.   Wear comfortable clothing and clothing appropriate for easy access to any Portacath or PICC line.   We strive to give you quality time with your provider. You may need to reschedule your appointment if you arrive late (15 or more minutes).  Arriving late affects you and other patients whose appointments are after yours.  Also, if you miss three or more appointments without notifying the office, you may be dismissed from the clinic at the provider's discretion.      For prescription refill requests, have your pharmacy contact our office and allow 72 hours for refills to be completed.    Today you received the following chemotherapy and/or immunotherapy agents: Opdivo      To help prevent nausea and vomiting after your treatment, we encourage you to take your nausea medication as directed.  BELOW ARE SYMPTOMS THAT SHOULD BE REPORTED IMMEDIATELY: *FEVER GREATER THAN 100.4 F (38 C) OR HIGHER *CHILLS OR SWEATING *NAUSEA AND VOMITING THAT IS NOT CONTROLLED WITH YOUR NAUSEA MEDICATION *UNUSUAL SHORTNESS OF BREATH *UNUSUAL BRUISING OR BLEEDING *URINARY PROBLEMS (pain or burning when urinating, or frequent urination) *BOWEL PROBLEMS (unusual diarrhea, constipation, pain near the anus) TENDERNESS IN MOUTH AND THROAT WITH OR WITHOUT PRESENCE OF ULCERS (sore throat, sores in mouth, or a toothache) UNUSUAL RASH, SWELLING OR PAIN  UNUSUAL VAGINAL DISCHARGE OR ITCHING   Items with * indicate a potential emergency and should be followed up as soon as possible or go to the Emergency Department if any problems should occur.  Please show the CHEMOTHERAPY ALERT CARD or IMMUNOTHERAPY ALERT CARD at check-in to the  Emergency Department and triage nurse.  Should you have questions after your visit or need to cancel or reschedule your appointment, please contact [facility]  Dept: [phone]  and follow the prompts.  Office hours are 8:00 a.m. to 4:30 p.m. Monday - Friday. Please note that voicemails left after 4:00 p.m. may not be returned until the following business day.  We are closed weekends and major holidays. You have access to a nurse at all times for urgent questions. Please call the main number to the clinic Dept: [phone] and follow the prompts.   For any non-urgent questions, you may also contact your provider using MyChart. We now offer e-Visits for anyone 18 and older to request care online for non-urgent symptoms. For details visit [email or web address].   Also download the MyChart app! Go to the app store, search "MyChart", open the app, select [place], and log in with your MyChart username and password.  Masks are optional in the cancer centers. If you would like for your care team to wear a mask while they are taking care of you, please let them know. You may have one support person who is at least 66 years old accompany you for your appointments. 

## 2022-09-24 LAB — T4: T4, Total: 5.9 ug/dL (ref 4.5–12.0)

## 2022-09-29 ENCOUNTER — Encounter: Payer: Self-pay | Admitting: Hematology

## 2022-09-29 NOTE — Progress Notes (Signed)
Frances Bright   HEMATOLOGY/ONCOLOGY CLINIC NOTE  Date of Service:09/23/2022   Patient Care Team: Frances Minium, MD as PCP - General (Family Medicine) Frances Fam, MD as Consulting Physician (Ophthalmology) Frances Bright, [facility], DO as Consulting Physician  CHIEF COMPLAINTS/PURPOSE OF CONSULTATION:  Follow-up for continued evaluation and management of metastatic melanoma  HISTORY OF PRESENTING ILLNESS:  Please see previous note for details on initial presentation  INTERVAL HISTORY [name] is a 66 y.o. female who is here for continued evaluation and management of her metastatic melanoma.  She notes that she tolerated her last dose of nivolumab without any new rashes and no significant pruritus. No diarrhea.  No other acute new focal toxicities. She notes no new lumps or bumps. No headaches or focal neurological deficits. She is scheduled for MRI of the brain in the first week of November.  We also ordered PET CT scan to evaluate her response to treatment with her immunotherapy. She would like to transfer her care to [facility] in [place] and we shall send the referral there. Labs done today were reviewed in detail with the patient.   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 Dr Sharol Roussel   Anxiety    Arthritis    Atrophic vaginitis    Diverticulosis 02/2008   Enteritis, candida 2011   GERD (gastroesophageal reflux disease)    Gluten intolerance 2012   H/O bilateral breast implants    Hypothyroidism    Melanoma (Frances Bright) 2001   back of neck   PMB (postmenopausal bleeding) 09/2012   On HRT - PUS/ SHGM with fibroids   Sleep apnea   Irritable bowel syndrome Melanoma back of neck - 2001 -- dermatologist every year -- [facility] Dr [place].  SURGICAL HISTORY: Past Surgical History:   Procedure Laterality Date   BREAST ENHANCEMENT SURGERY Bilateral 30 years ago   silicone, replaced with saline 6 years ago   [place]     over 42yr ago for abnormal pap smear   MASS EXCISION N/A 06/02/2022   Procedure: EXCISION SKIN NODULES LEFT BREAST, Left Arm,ABDOMINAL WALL, AND RIGHT NECK;  Surgeon: BCoralie Keens MD;  [facility];  Service: General;  Laterality: N/A;   MELANOMA EXCISION  2001   back of neck   MYOMECTOMY  about 1987   for fibroids   UMBILICAL HERNIA REPAIR N/A 07/31/2021   Procedure: UMBILICAL HERNIA REPAIR;  Surgeon: BCoralie Keens MD;  [facility]  Service: General;  Laterality: N/A;  LMA    SOCIAL HISTORY: Social History   Socioeconomic History   Marital status: Married    Spouse name: Not on file   Number of children: 0   Years of education: Not on file   Highest education level: Not on file  Occupational History   Not on file  Tobacco Use   Smoking status: Never   Smokeless tobacco: Never  Vaping Use   Vaping Use: Never used  Substance and Sexual Activity   Alcohol use: No   Drug use: No   Sexual activity: Not Currently    Partners: Male    Birth control/protection: Post-menopausal  Other Topics Concern   Not on file  Social History Narrative   Not on file   Social Determinants of Health  Financial Resource Strain: Not on file  Food Insecurity: Not on file  Transportation Needs: Not on file  Physical Activity: Not on file  Stress: Not on file  Social Connections: Not on file  Intimate Partner Violence: Not on file  No cigarettes Marijuana in the past ETOH social use  FAMILY HISTORY: Family History  Problem Relation Age of Onset   Hypertension Mother    Hyperlipidemia Mother    Cancer Father   Father-- brain cancer   ALLERGIES:  is allergic to prednisone and cephalexin. Milk is diarrhea Amitriptyline-excessive somnolence Gluten Testosterone HRT  cream-  MEDICATIONS:  Current Outpatient Medications  Medication Sig Dispense Refill   famotidine (PEPCID) 20 MG tablet Take 1 tablet (20 mg total) by mouth daily. 30 tablet 2   hydrOXYzine (ATARAX) 25 MG tablet Take 1 tablet (25 mg total) by mouth 3 (three) times daily as needed. (Patient not taking: Reported on 08/04/2022) 30 tablet 0   levothyroxine (SYNTHROID) 50 MCG tablet Take 0.5 tablets (25 mcg total) by mouth daily before breakfast. 90 tablet 0   liothyronine (CYTOMEL) 5 MCG tablet Take 1 tablet (5 mcg total) by mouth daily before breakfast. 180 tablet 0   loratadine (CLARITIN) 10 MG tablet Take 1 tablet (10 mg total) by mouth daily. 30 tablet 2   ondansetron (ZOFRAN) 8 MG tablet Take 1 tablet (8 mg total) by mouth every 8 (eight) hours as needed for nausea or vomiting. (Patient not taking: Reported on 06/30/2022) 20 tablet 0   Propylene Glycol (SYSTANE BALANCE) 0.6 % SOLN Place 1 drop into both eyes in the morning and at bedtime.     No current facility-administered medications for this visit.    REVIEW OF SYSTEMS:   10 Point review of Systems was done is negative except as noted above.  PHYSICAL EXAMINATION: Vitals:   09/23/22 1004  BP: 126/89  Pulse: 72  Resp: 15  Temp: 97.9 F (36.6 C)  SpO2: 100%  NAD GENERAL:alert, in no acute distress and comfortable SKIN: no acute rashes, no significant lesions EYES: conjunctiva are pink and non-injected, sclera anicteric OROPHARYNX: MMM, no exudates, no oropharyngeal erythema or ulceration NECK: supple, no JVD LYMPH:  no palpable lymphadenopathy in the cervical, axillary or inguinal regions LUNGS: clear to auscultation b/l with normal respiratory effort HEART: regular rate & rhythm ABDOMEN:  normoactive bowel sounds , non tender, not distended. Extremity: no pedal edema PSYCH: alert & oriented x 3 with fluent speech NEURO: no focal motor/sensory deficits  I have reviewed the data as listed  .    Latest Ref Rng & Units  09/23/2022    9:43 AM 09/03/2022   11:13 AM 08/20/2022   11:19 AM  CBC  WBC 4.0 - 10.5 K/uL 3.6  3.4  3.6   Hemoglobin 12.0 - 15.0 g/dL 12.9  13.4  13.7   Hematocrit 36.0 - 46.0 % 37.7  39.4  39.7   Platelets 150 - 400 K/uL 221  219  231     .    Latest Ref Rng & Units 09/23/2022    9:43 AM 09/03/2022   11:13 AM 08/20/2022   11:19 AM  CMP  Glucose 70 - 99 mg/dL 105  99  81   BUN 8 - 23 mg/dL '17  21  18   '$ Creatinine 0.44 - 1.00 mg/dL 0.72  0.75  0.76   Sodium 135 - 145 mmol/L 140  140  139   Potassium 3.5 - 5.1 mmol/L 3.9  4.0  3.9   Chloride 98 - 111 mmol/L 106  105  105   CO2 22 - 32 mmol/L '29  30  28   '$ Calcium 8.9 - 10.3 mg/dL 9.0  9.1  9.6   Total Protein 6.5 - 8.1 g/dL 6.8  6.7  7.1   Total Bilirubin 0.3 - 1.2 mg/dL 1.1  1.1  1.1   Alkaline Phos 38 - 126 U/L 77  77  76   AST 15 - 41 U/L '22  20  22   '$ ALT 0 - 44 U/L '28  20  26    '$ . Lab Results  Component Value Date   LDH 159 09/03/2022   CYTOLOGY - NON PAP  CASE: [id number]  PATIENT: Burley Saver  Non-Gynecological Cytology Report      Clinical History: Melanoma of the neck 20 years ago, multiple new small  sub Q nodules  Specimen Submitted:  A. SUBCUTANEOUS NODULE, LEFT LATERAL THIGH, FINE  NEEDLE ASPIRATION:    FINAL MICROSCOPIC DIAGNOSIS:  - Malignant cells present  - Consistent with metastatic melanoma   RADIOGRAPHIC STUDIES: I have personally reviewed the radiological images as listed and agreed with the findings in the report. No results found.  ASSESSMENT & PLAN:    66 year old female with  #1  Recently diagnosed metastatic melanoma presenting with right upper neck mass and numerous skin nodules over upper extremities trunk and upper proximal thigh. #2 remote history of melanoma in 2001 resected from the back #3 on multiple supplements follows with functional medicine #4 I indeterminate lung nodule Plan -Labs done today were discussed in detail with the patient -She has no notable new  toxicities from her current nivolumab immunotherapy and is appropriate to proceed with her next cycle of nivolumab. -We will plan to get a PET CT scan in first week of November to evaluate response to immunotherapy. -She is also scheduled for MRI of the brain with Dr. Mickeal Skinner on 10/17/2022. -Patient's nivolumab orders were placed and signed. -Her referral paperwork will be sent to [facility] for continued treatment for her metastatic melanoma.  Follow-up Plz schedule next 3 cycles of Nivolumab every 2 weeks as pre orders. PET/CT in 2-3 weeks  The total time spent in the appointment was 32 minutes*.  All of the patient's questions were answered with apparent satisfaction. The patient knows to call the clinic with any problems, questions or concerns.   Sullivan Lone MD MS AAHIVMS [facility] Hematology/Oncology Physician [place]  .*Total Encounter Time as defined by the Centers for Medicare and Medicaid Services includes, in addition to the face-to-face time of a patient visit (documented in the note above) non-face-to-face time: obtaining and reviewing outside history, ordering and reviewing medications, tests or procedures, care coordination (communications with other health care professionals or caregivers) and documentation in the medical record.  [place]

## 2022-10-01 ENCOUNTER — Other Ambulatory Visit: Payer: Medicare HMO

## 2022-10-01 ENCOUNTER — Ambulatory Visit: Payer: Medicare HMO

## 2022-10-01 ENCOUNTER — Other Ambulatory Visit: Payer: Self-pay

## 2022-10-01 ENCOUNTER — Ambulatory Visit: Payer: Medicare HMO | Admitting: Hematology

## 2022-10-01 DIAGNOSIS — C439 Malignant melanoma of skin, unspecified: Secondary | ICD-10-CM

## 2022-10-06 ENCOUNTER — Inpatient Hospital Stay: Payer: Medicare HMO

## 2022-10-06 ENCOUNTER — Other Ambulatory Visit: Payer: Self-pay

## 2022-10-06 ENCOUNTER — Inpatient Hospital Stay (HOSPITAL_BASED_OUTPATIENT_CLINIC_OR_DEPARTMENT_OTHER): Payer: Medicare HMO | Admitting: Hematology

## 2022-10-06 DIAGNOSIS — Z809 Family history of malignant neoplasm, unspecified: Secondary | ICD-10-CM | POA: Diagnosis not present

## 2022-10-06 DIAGNOSIS — Z7189 Other specified counseling: Secondary | ICD-10-CM

## 2022-10-06 DIAGNOSIS — C439 Malignant melanoma of skin, unspecified: Secondary | ICD-10-CM

## 2022-10-06 DIAGNOSIS — Z888 Allergy status to other drugs, medicaments and biological substances status: Secondary | ICD-10-CM | POA: Diagnosis not present

## 2022-10-06 DIAGNOSIS — C434 Malignant melanoma of scalp and neck: Secondary | ICD-10-CM | POA: Diagnosis not present

## 2022-10-06 DIAGNOSIS — Z8249 Family history of ischemic heart disease and other diseases of the circulatory system: Secondary | ICD-10-CM | POA: Diagnosis not present

## 2022-10-06 DIAGNOSIS — Z5112 Encounter for antineoplastic immunotherapy: Secondary | ICD-10-CM | POA: Diagnosis not present

## 2022-10-06 DIAGNOSIS — E039 Hypothyroidism, unspecified: Secondary | ICD-10-CM | POA: Diagnosis not present

## 2022-10-06 DIAGNOSIS — Z8349 Family history of other endocrine, nutritional and metabolic diseases: Secondary | ICD-10-CM | POA: Diagnosis not present

## 2022-10-06 DIAGNOSIS — Z808 Family history of malignant neoplasm of other organs or systems: Secondary | ICD-10-CM | POA: Diagnosis not present

## 2022-10-06 DIAGNOSIS — Z79899 Other long term (current) drug therapy: Secondary | ICD-10-CM | POA: Diagnosis not present

## 2022-10-06 DIAGNOSIS — Z79890 Hormone replacement therapy (postmenopausal): Secondary | ICD-10-CM | POA: Diagnosis not present

## 2022-10-06 DIAGNOSIS — Z881 Allergy status to other antibiotic agents status: Secondary | ICD-10-CM | POA: Diagnosis not present

## 2022-10-06 DIAGNOSIS — C792 Secondary malignant neoplasm of skin: Secondary | ICD-10-CM | POA: Diagnosis not present

## 2022-10-06 LAB — CMP (CANCER CENTER ONLY)
ALT: 22 U/L (ref 0–44)
AST: 22 U/L (ref 15–41)
Albumin: 4.4 g/dL (ref 3.5–5.0)
Alkaline Phosphatase: 74 U/L (ref 38–126)
Anion gap: 5 (ref 5–15)
BUN: 16 mg/dL (ref 8–23)
CO2: 29 mmol/L (ref 22–32)
Calcium: 9.1 mg/dL (ref 8.9–10.3)
Chloride: 104 mmol/L (ref 98–111)
Creatinine: 0.72 mg/dL (ref 0.44–1.00)
GFR, Estimated: >60 mL/min (ref >60)
Glucose, Bld: 79 mg/dL (ref 70–99)
Potassium: 3.9 mmol/L (ref 3.5–5.1)
Sodium: 138 mmol/L (ref 135–145)
Total Bilirubin: 1.4 mg/dL — ABNORMAL HIGH (ref 0.3–1.2)
Total Protein: 6.7 g/dL (ref 6.5–8.1)

## 2022-10-06 LAB — CBC WITH DIFFERENTIAL (CANCER CENTER ONLY)
Abs Immature Granulocytes: 0 10*3/uL (ref 0.00–0.07)
Basophils Absolute: 0 10*3/uL (ref 0.0–0.1)
Basophils Relative: 1 %
Eosinophils Absolute: 0.1 10*3/uL (ref 0.0–0.5)
Eosinophils Relative: 2 %
HCT: 38.8 % (ref 36.0–46.0)
Hemoglobin: 13.2 g/dL (ref 12.0–15.0)
Immature Granulocytes: 0 %
Lymphocytes Relative: 33 %
Lymphs Abs: 1.2 10*3/uL (ref 0.7–4.0)
MCH: 30.5 pg (ref 26.0–34.0)
MCHC: 34 g/dL (ref 30.0–36.0)
MCV: 89.6 fL (ref 80.0–100.0)
Monocytes Absolute: 0.4 10*3/uL (ref 0.1–1.0)
Monocytes Relative: 10 %
Neutro Abs: 2 10*3/uL (ref 1.7–7.7)
Neutrophils Relative %: 54 %
Platelet Count: 220 10*3/uL (ref 150–400)
RBC: 4.33 MIL/uL (ref 3.87–5.11)
RDW: 12.3 % (ref 11.5–15.5)
WBC Count: 3.6 10*3/uL — ABNORMAL LOW (ref 4.0–10.5)
nRBC: 0 % (ref 0.0–0.2)

## 2022-10-06 MED ORDER — SODIUM CHLORIDE 0.9 % IV SOLN: Freq: Once | INTRAVENOUS | Status: AC

## 2022-10-06 MED ORDER — SODIUM CHLORIDE 0.9 % IV SOLN
240.0000 mg | Freq: Once | INTRAVENOUS | Status: AC
  Administered 2022-10-06: 240 mg via INTRAVENOUS
  Filled 2022-10-06: qty 24

## 2022-10-06 NOTE — Progress Notes (Signed)
[facility]   HEMATOLOGY/ONCOLOGY CLINIC NOTE  Date of Service:10/06/22    Patient Care Team: Midge Minium, MD as PCP - General (Family Medicine) Monna Fam, MD as Consulting Physician (Ophthalmology) Lady Gary, [facility], DO as Consulting Physician  CHIEF COMPLAINTS/PURPOSE OF CONSULTATION:  Follow-up for continued evaluation and management of metastatic melanoma  HISTORY OF PRESENTING ILLNESS:  Please see previous note for details on initial presentation  INTERVAL HISTORY [name] is a 66 y.o. female who is here for continued evaluation and management of her metastatic melanoma. She is here today to start cycle 2 day 1 of Nivolumab.  Patient was last seen by me on 09/23/2022 and was doing well without any toxicities with Nivolumab.   She reports no issues other than itching since previous visit. She denies any rashes, mouth sores, shortness of breath, diarrhea, lumps, fever, chills, back pain, new nodules on the groin, and abdominal pain.   She notes that she still nodules near her inner thigh, which has been there.   She notes she has is going to be moving soon to [place] and wants to receive her treatment at [facility]. She notes that they are scheduled to move to [place] on 10/19/2022. However, her insurance will not work till December 2023. She is interested to schedule her treatment here on 10/20/2022 and then change her move date. But, she will let me know if we need to change her treatment date.      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 Dr Sharol Roussel   Anxiety    Arthritis    Atrophic vaginitis    Diverticulosis 02/2008   Enteritis, candida 2011   GERD (gastroesophageal reflux disease)    Gluten intolerance 2012   H/O bilateral breast implants    Hypothyroidism     Melanoma ([place]) 2001   back of neck   PMB (postmenopausal bleeding) 09/2012   On HRT - PUS/ SHGM with fibroids   Sleep apnea   Irritable bowel syndrome Melanoma back of neck - 2001 -- dermatologist every year -- [facility] Dr [place].  SURGICAL HISTORY: Past Surgical History:  Procedure Laterality Date   BREAST ENHANCEMENT SURGERY Bilateral 30 years ago   silicone, replaced with saline 6 years ago   [place]     over 39yr ago for abnormal pap smear   MASS EXCISION N/A 06/02/2022   Procedure: EXCISION SKIN NODULES LEFT BREAST, Left Arm,ABDOMINAL WALL, AND RIGHT NECK;  Surgeon: BCoralie Keens MD;  [facility]  Service: General;  Laterality: N/A;   MELANOMA EXCISION  2001   back of neck   MYOMECTOMY  about 1987   for fibroids   UMBILICAL HERNIA REPAIR N/A 07/31/2021   Procedure: UMBILICAL HERNIA REPAIR;  Surgeon: BCoralie Keens MD;  [facility]  Service: General;  Laterality: N/A;  LMA    SOCIAL HISTORY: Social History   Socioeconomic History   Marital status: Married    Spouse name: Not on file   Number of children: 0   Years of education: Not on file   Highest education level: Not on file  Occupational History   Not on file  Tobacco Use   Smoking status: Never   Smokeless tobacco: Never  Vaping Use   Vaping Use:  Never used  Substance and Sexual Activity   Alcohol use: No   Drug use: No   Sexual activity: Not Currently    Partners: Male    Birth control/protection: Post-menopausal  Other Topics Concern   Not on file  Social History Narrative   Not on file   Social Determinants of Health   Financial Resource Strain: Not on file  Food Insecurity: Not on file  Transportation Needs: Not on file  Physical Activity: Not on file  Stress: Not on file  Social Connections: Not on file  Intimate Partner Violence: Not on file  No cigarettes Marijuana in the past ETOH social use  FAMILY  HISTORY: Family History  Problem Relation Age of Onset   Hypertension Mother    Hyperlipidemia Mother    Cancer Father   Father-- brain cancer   ALLERGIES:  is allergic to prednisone and cephalexin. Milk is diarrhea Amitriptyline-excessive somnolence Gluten Testosterone HRT cream-  MEDICATIONS:  Current Outpatient Medications  Medication Sig Dispense Refill   famotidine (PEPCID) 20 MG tablet Take 1 tablet (20 mg total) by mouth daily. 30 tablet 2   hydrOXYzine (ATARAX) 25 MG tablet Take 1 tablet (25 mg total) by mouth 3 (three) times daily as needed. (Patient not taking: Reported on 08/04/2022) 30 tablet 0   levothyroxine (SYNTHROID) 50 MCG tablet Take 0.5 tablets (25 mcg total) by mouth daily before breakfast. 90 tablet 0   liothyronine (CYTOMEL) 5 MCG tablet Take 1 tablet (5 mcg total) by mouth daily before breakfast. 180 tablet 0   loratadine (CLARITIN) 10 MG tablet Take 1 tablet (10 mg total) by mouth daily. 30 tablet 2   ondansetron (ZOFRAN) 8 MG tablet Take 1 tablet (8 mg total) by mouth every 8 (eight) hours as needed for nausea or vomiting. (Patient not taking: Reported on 06/30/2022) 20 tablet 0   Propylene Glycol (SYSTANE BALANCE) 0.6 % SOLN Place 1 drop into both eyes in the morning and at bedtime.     No current facility-administered medications for this visit.    REVIEW OF SYSTEMS:   10 Point review of Systems was done is negative except as noted above.  PHYSICAL EXAMINATION: Vitals:   10/06/22 1413  BP: 125/62  Pulse: 69  Resp: 15  Temp: 97.8 F (36.6 C)  SpO2: 100%   NAD GENERAL:alert, in no acute distress and comfortable SKIN: no acute rashes, no significant lesions EYES: conjunctiva are pink and non-injected, sclera anicteric OROPHARYNX: MMM, no exudates, no oropharyngeal erythema or ulceration NECK: supple, no JVD LYMPH:  no palpable lymphadenopathy in the cervical, axillary or inguinal regions LUNGS: clear to auscultation b/l with normal respiratory  effort HEART: regular rate & rhythm ABDOMEN:  normoactive bowel sounds , non tender, not distended. Extremity: no pedal edema PSYCH: alert & oriented x 3 with fluent speech NEURO: no focal motor/sensory deficits  I have reviewed the data as listed  .    Latest Ref Rng & Units 10/06/2022    2:05 PM 09/23/2022    9:43 AM 09/03/2022   11:13 AM  CBC  WBC 4.0 - 10.5 K/uL 3.6  3.6  3.4   Hemoglobin 12.0 - 15.0 g/dL 13.2  12.9  13.4   Hematocrit 36.0 - 46.0 % 38.8  37.7  39.4   Platelets 150 - 400 K/uL 220  221  219     .    Latest Ref Rng & Units 09/23/2022    9:43 AM 09/03/2022   11:13 AM 08/20/2022  11:19 AM  CMP  Glucose 70 - 99 mg/dL 105  99  81   BUN 8 - 23 mg/dL '17  21  18   '$ Creatinine 0.44 - 1.00 mg/dL 0.72  0.75  0.76   Sodium 135 - 145 mmol/L 140  140  139   Potassium 3.5 - 5.1 mmol/L 3.9  4.0  3.9   Chloride 98 - 111 mmol/L 106  105  105   CO2 22 - 32 mmol/L '29  30  28   '$ Calcium 8.9 - 10.3 mg/dL 9.0  9.1  9.6   Total Protein 6.5 - 8.1 g/dL 6.8  6.7  7.1   Total Bilirubin 0.3 - 1.2 mg/dL 1.1  1.1  1.1   Alkaline Phos 38 - 126 U/L 77  77  76   AST 15 - 41 U/L '22  20  22   '$ ALT 0 - 44 U/L '28  20  26    '$ . Lab Results  Component Value Date   LDH 159 09/03/2022   CYTOLOGY - NON PAP  CASE: [id number]  PATIENT: Burley Saver  Non-Gynecological Cytology Report      Clinical History: Melanoma of the neck 20 years ago, multiple new small  sub Q nodules  Specimen Submitted:  A. SUBCUTANEOUS NODULE, LEFT LATERAL THIGH, FINE  NEEDLE ASPIRATION:    FINAL MICROSCOPIC DIAGNOSIS:  - Malignant cells present  - Consistent with metastatic melanoma   RADIOGRAPHIC STUDIES: I have personally reviewed the radiological images as listed and agreed with the findings in the report. No results found.  ASSESSMENT & PLAN:    66 year old female with  #1  Recently diagnosed metastatic melanoma presenting with right upper neck mass and numerous skin nodules over upper  extremities trunk and upper proximal thigh. #2 remote history of melanoma in 2001 resected from the back #3 on multiple supplements follows with functional medicine #4 I indeterminate lung nodule  Plan -Labs done today were discussed in detail with the patient. CBC and CMP has been stable. -She has no notable new toxicities from her current nivolumab immunotherapy and is appropriate to proceed with her next cycle of nivolumab. -We will plan to get a PET CT scan in first week of November to evaluate response to immunotherapy. -She is also scheduled for MRI of the brain with Dr. Mickeal Skinner on 10/17/2022. -Patient's nivolumab orders were placed and signed. -Her referral paperwork will be sent to [place] for continued treatment for her metastatic melanoma. -Pembrolizumab orders reviewed and signed.  Follow-up: Per integrated scheduling  The total time spent in the appointment was 30 minutes* .  All of the patient's questions were answered with apparent satisfaction. The patient knows to call the clinic with any problems, questions or concerns.   Zettie Cooley, am acting as a scribe for Sullivan Lone, MD.  Sullivan Lone MD [place] AAHIVMS [facility] Hematology/Oncology Physician [facility]  .*Total Encounter Time as defined by the Centers for Medicare and Medicaid Services includes, in addition to the face-to-face time of a patient visit (documented in the note above) non-face-to-face time: obtaining and reviewing outside history, ordering and reviewing medications, tests or procedures, care coordination (communications with other health care professionals or caregivers) and documentation in the medical record.

## 2022-10-06 NOTE — Patient Instructions (Signed)
[facility] MEDICAL ONCOLOGY   Discharge Instructions: Thank you for choosing [facility] to provide your oncology and hematology care.   If you have a lab appointment with the Cancer Center, please go directly to the Cancer Center and check in at the registration area.   Wear comfortable clothing and clothing appropriate for easy access to any Portacath or PICC line.   We strive to give you quality time with your provider. You may need to reschedule your appointment if you arrive late (15 or more minutes).  Arriving late affects you and other patients whose appointments are after yours.  Also, if you miss three or more appointments without notifying the office, you may be dismissed from the clinic at the provider's discretion.      For prescription refill requests, have your pharmacy contact our office and allow 72 hours for refills to be completed.    Today you received the following chemotherapy and/or immunotherapy agents: nivolumab      To help prevent nausea and vomiting after your treatment, we encourage you to take your nausea medication as directed.  BELOW ARE SYMPTOMS THAT SHOULD BE REPORTED IMMEDIATELY: *FEVER GREATER THAN 100.4 F (38 C) OR HIGHER *CHILLS OR SWEATING *NAUSEA AND VOMITING THAT IS NOT CONTROLLED WITH YOUR NAUSEA MEDICATION *UNUSUAL SHORTNESS OF BREATH *UNUSUAL BRUISING OR BLEEDING *URINARY PROBLEMS (pain or burning when urinating, or frequent urination) *BOWEL PROBLEMS (unusual diarrhea, constipation, pain near the anus) TENDERNESS IN MOUTH AND THROAT WITH OR WITHOUT PRESENCE OF ULCERS (sore throat, sores in mouth, or a toothache) UNUSUAL RASH, SWELLING OR PAIN  UNUSUAL VAGINAL DISCHARGE OR ITCHING   Items with * indicate a potential emergency and should be followed up as soon as possible or go to the Emergency Department if any problems should occur.  Please show the CHEMOTHERAPY ALERT CARD or IMMUNOTHERAPY ALERT CARD at check-in  to the Emergency Department and triage nurse.  Should you have questions after your visit or need to cancel or reschedule your appointment, please contact [facility]ICAL [facility]COL[facility]  Dept: [phone]  and follow the prompts.  Office hours are 8:00 a.m. to 4:30 p.m. Monday - Friday. Please note that voicemails left after 4:00 p.m. may not be returned until the following business day.  We are closed weekends and major holidays. You have access to a nurse at all times for urgent questions. Please call the main number to the clinic Dept: [phone] and follow the prompts.   For any non-urgent questions, you may also contact your provider using MyChart. We now offer e-Visits for anyone 18 and older to request care online for non-urgent symptoms. For details visit [email or web address].   Also download the MyChart app! Go to the app store, search "MyChart", open the app, select [facility], and log in with your MyChart username and password.  Masks are optional in the cancer centers. If you would like for your care team to wear a mask while they are taking care of you, please let them know. You may have one support person who is at least 66 years old accompany you for your appointments. 

## 2022-10-12 ENCOUNTER — Encounter: Payer: Self-pay | Admitting: Hematology

## 2022-10-15 ENCOUNTER — Ambulatory Visit (HOSPITAL_COMMUNITY)
Admission: RE | Admit: 2022-10-15 | Discharge: 2022-10-15 | Disposition: A | Discharge status: Home / Self Care | Payer: Medicare HMO | Source: Ambulatory Visit | Attending: Hematology | Admitting: Hematology

## 2022-10-15 DIAGNOSIS — C439 Malignant melanoma of skin, unspecified: Secondary | ICD-10-CM | POA: Insufficient documentation

## 2022-10-15 DIAGNOSIS — R918 Other nonspecific abnormal finding of lung field: Secondary | ICD-10-CM | POA: Diagnosis not present

## 2022-10-15 LAB — GLUCOSE, CAPILLARY: Glucose-Capillary: 103 mg/dL — ABNORMAL HIGH (ref 70–99)

## 2022-10-15 MED ORDER — FLUDEOXYGLUCOSE F - 18 (FDG) INJECTION
5.3000 | Freq: Once | INTRAVENOUS | Status: AC
  Administered 2022-10-15: 5.3 via INTRAVENOUS

## 2022-10-16 ENCOUNTER — Encounter (HOSPITAL_COMMUNITY): Payer: Medicare HMO

## 2022-10-17 ENCOUNTER — Ambulatory Visit
Admission: RE | Admit: 2022-10-17 | Discharge: 2022-10-17 | Disposition: A | Discharge status: Home / Self Care | Payer: Medicare HMO | Source: Ambulatory Visit | Attending: Internal Medicine | Admitting: Internal Medicine

## 2022-10-17 DIAGNOSIS — R9089 Other abnormal findings on diagnostic imaging of central nervous system: Secondary | ICD-10-CM

## 2022-10-17 DIAGNOSIS — C439 Malignant melanoma of skin, unspecified: Secondary | ICD-10-CM | POA: Diagnosis not present

## 2022-10-17 MED ORDER — GADOPICLENOL 0.5 MMOL/ML IV SOLN
5.0000 mL | Freq: Once | INTRAVENOUS | Status: AC | PRN
  Administered 2022-10-17: 5 mL via INTRAVENOUS

## 2022-10-20 ENCOUNTER — Inpatient Hospital Stay: Payer: Medicare HMO | Admitting: Hematology

## 2022-10-20 ENCOUNTER — Other Ambulatory Visit: Payer: Self-pay

## 2022-10-20 ENCOUNTER — Inpatient Hospital Stay: Payer: Medicare HMO

## 2022-10-20 ENCOUNTER — Inpatient Hospital Stay: Payer: Medicare HMO | Attending: Hematology | Admitting: Internal Medicine

## 2022-10-20 DIAGNOSIS — Z7189 Other specified counseling: Secondary | ICD-10-CM

## 2022-10-20 DIAGNOSIS — I6782 Cerebral ischemia: Secondary | ICD-10-CM | POA: Insufficient documentation

## 2022-10-20 DIAGNOSIS — Z809 Family history of malignant neoplasm, unspecified: Secondary | ICD-10-CM | POA: Diagnosis not present

## 2022-10-20 DIAGNOSIS — C439 Malignant melanoma of skin, unspecified: Secondary | ICD-10-CM

## 2022-10-20 DIAGNOSIS — C434 Malignant melanoma of scalp and neck: Secondary | ICD-10-CM | POA: Insufficient documentation

## 2022-10-20 DIAGNOSIS — C799 Secondary malignant neoplasm of unspecified site: Secondary | ICD-10-CM | POA: Insufficient documentation

## 2022-10-20 DIAGNOSIS — Z8349 Family history of other endocrine, nutritional and metabolic diseases: Secondary | ICD-10-CM | POA: Diagnosis not present

## 2022-10-20 DIAGNOSIS — Z8249 Family history of ischemic heart disease and other diseases of the circulatory system: Secondary | ICD-10-CM | POA: Insufficient documentation

## 2022-10-20 DIAGNOSIS — Z85820 Personal history of malignant melanoma of skin: Secondary | ICD-10-CM | POA: Diagnosis not present

## 2022-10-20 DIAGNOSIS — Z5112 Encounter for antineoplastic immunotherapy: Secondary | ICD-10-CM | POA: Diagnosis not present

## 2022-10-20 DIAGNOSIS — Z79890 Hormone replacement therapy (postmenopausal): Secondary | ICD-10-CM | POA: Diagnosis not present

## 2022-10-20 DIAGNOSIS — R911 Solitary pulmonary nodule: Secondary | ICD-10-CM | POA: Diagnosis not present

## 2022-10-20 DIAGNOSIS — Z881 Allergy status to other antibiotic agents status: Secondary | ICD-10-CM | POA: Insufficient documentation

## 2022-10-20 DIAGNOSIS — R9089 Other abnormal findings on diagnostic imaging of central nervous system: Secondary | ICD-10-CM

## 2022-10-20 DIAGNOSIS — Z79899 Other long term (current) drug therapy: Secondary | ICD-10-CM | POA: Diagnosis not present

## 2022-10-20 DIAGNOSIS — Z888 Allergy status to other drugs, medicaments and biological substances status: Secondary | ICD-10-CM | POA: Insufficient documentation

## 2022-10-20 LAB — CBC WITH DIFFERENTIAL (CANCER CENTER ONLY)
Abs Immature Granulocytes: 0.01 10*3/uL (ref 0.00–0.07)
Basophils Absolute: 0.1 10*3/uL (ref 0.0–0.1)
Basophils Relative: 2 %
Eosinophils Absolute: 0.1 10*3/uL (ref 0.0–0.5)
Eosinophils Relative: 1 %
HCT: 37.1 % (ref 36.0–46.0)
Hemoglobin: 13 g/dL (ref 12.0–15.0)
Immature Granulocytes: 0 %
Lymphocytes Relative: 32 %
Lymphs Abs: 1.3 10*3/uL (ref 0.7–4.0)
MCH: 31 pg (ref 26.0–34.0)
MCHC: 35 g/dL (ref 30.0–36.0)
MCV: 88.5 fL (ref 80.0–100.0)
Monocytes Absolute: 0.4 10*3/uL (ref 0.1–1.0)
Monocytes Relative: 10 %
Neutro Abs: 2.2 10*3/uL (ref 1.7–7.7)
Neutrophils Relative %: 55 %
Platelet Count: 236 10*3/uL (ref 150–400)
RBC: 4.19 MIL/uL (ref 3.87–5.11)
RDW: 12.3 % (ref 11.5–15.5)
WBC Count: 4.1 10*3/uL (ref 4.0–10.5)
nRBC: 0 % (ref 0.0–0.2)

## 2022-10-20 LAB — CMP (CANCER CENTER ONLY)
ALT: 20 U/L (ref 0–44)
AST: 20 U/L (ref 15–41)
Albumin: 4.4 g/dL (ref 3.5–5.0)
Alkaline Phosphatase: 78 U/L (ref 38–126)
Anion gap: 6 (ref 5–15)
BUN: 20 mg/dL (ref 8–23)
CO2: 27 mmol/L (ref 22–32)
Calcium: 9.1 mg/dL (ref 8.9–10.3)
Chloride: 106 mmol/L (ref 98–111)
Creatinine: 0.84 mg/dL (ref 0.44–1.00)
GFR, Estimated: >60 mL/min (ref >60)
Glucose, Bld: 118 mg/dL — ABNORMAL HIGH (ref 70–99)
Potassium: 4 mmol/L (ref 3.5–5.1)
Sodium: 139 mmol/L (ref 135–145)
Total Bilirubin: 1.1 mg/dL (ref 0.3–1.2)
Total Protein: 6.6 g/dL (ref 6.5–8.1)

## 2022-10-20 LAB — CORTISOL: Cortisol, Plasma: 4.5 ug/dL

## 2022-10-20 LAB — TSH: TSH: 0.931 u[IU]/mL (ref 0.350–4.500)

## 2022-10-20 NOTE — Progress Notes (Signed)
[facility]   HEMATOLOGY/ONCOLOGY CLINIC NOTE  Date of Service:10/20/22    Patient Care Team: Midge Minium, MD as PCP - General (Family Medicine) Monna Fam, MD as Consulting Physician (Ophthalmology) Lady Gary, [facility], DO as Consulting Physician  CHIEF COMPLAINTS/PURPOSE OF CONSULTATION:  Follow-up for continued evaluation and management of metastatic melanoma  HISTORY OF PRESENTING ILLNESS:  Please see previous note for details on initial presentation  INTERVAL HISTORY [name] is a 66 y.o. female who is here for continued evaluation and management of her metastatic melanoma.  Patient was last seen by me on 09/23/2022 and was doing well without any toxicities with Nivolumab.   She is here with her husband and reports she is doing well overall during today's visit.   She complains of dehydration, weight loss, and loss of appetite since our last visit. She has discontinued her Claritin.   She reports of skin itchiness and loss of appetite after her last cycle.   She reports that she does not want to receive her infusion today because of stress of moving, weight loss, and loss of appetite.   She reports her recent labs around a month ago showed her thyroid levels were in the normal range. She has been on thyroid replacement for more than 2-3 years.  She denies abdominal pain, back pain, skin rash, and leg swelling. Although, she reports of mild lump on her lower back.    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 Dr Sharol Roussel   Anxiety    Arthritis    Atrophic vaginitis    Diverticulosis 02/2008   Enteritis, candida 2011   GERD (gastroesophageal reflux disease)    Gluten intolerance 2012   H/O bilateral breast implants    Hypothyroidism    Melanoma ([place]) 2001   back of neck   PMB (postmenopausal bleeding)  09/2012   On HRT - PUS/ SHGM with fibroids   Sleep apnea   Irritable bowel syndrome Melanoma back of neck - 2001 -- dermatologist every year -- [facility] [place].  SURGICAL HISTORY: Past Surgical History:  Procedure Laterality Date   BREAST ENHANCEMENT SURGERY Bilateral 30 years ago   silicone, replaced with saline 6 years ago   [place]     over 78yr ago for abnormal pap smear   MASS EXCISION N/A 06/02/2022   Procedure: EXCISION SKIN NODULES LEFT BREAST, Left Arm,ABDOMINAL WALL, AND RIGHT NECK;  Surgeon: BCoralie Keens MD;  Location: [facility]  Service: General;  Laterality: N/A;   MELANOMA EXCISION  2001   back of neck   MYOMECTOMY  about 1987   for fibroids   UMBILICAL HERNIA REPAIR N/A 07/31/2021   Procedure: UMBILICAL HERNIA REPAIR;  Surgeon: BCoralie Keens MD;  Location: [facility]  Service: General;  Laterality: N/A;  LMA    SOCIAL HISTORY: Social History   Socioeconomic History   Marital status: Married    Spouse name: Not on file   Number of children: 0   Years of education: Not on file   Highest education level: Not on file  Occupational History   Not on file  Tobacco Use   Smoking status: Never   Smokeless tobacco: Never  Vaping Use   Vaping Use: Never used  Substance and Sexual Activity   Alcohol  use: No   Drug use: No   Sexual activity: Not Currently    Partners: Male    Birth control/protection: Post-menopausal  Other Topics Concern   Not on file  Social History Narrative   Not on file   Social Determinants of Health   Financial Resource Strain: Not on file  Food Insecurity: Not on file  Transportation Needs: Not on file  Physical Activity: Not on file  Stress: Not on file  Social Connections: Not on file  Intimate Partner Violence: Not on file  No cigarettes Marijuana in the past ETOH social use  FAMILY HISTORY: Family History  Problem Relation Age of Onset   Hypertension Mother     Hyperlipidemia Mother    Cancer Father   Father-- brain cancer   ALLERGIES:  is allergic to prednisone and cephalexin. Milk is diarrhea Amitriptyline-excessive somnolence Gluten Testosterone HRT cream-  MEDICATIONS:  Current Outpatient Medications  Medication Sig Dispense Refill   famotidine (PEPCID) 20 MG tablet Take 1 tablet (20 mg total) by mouth daily. 30 tablet 2   hydrOXYzine (ATARAX) 25 MG tablet Take 1 tablet (25 mg total) by mouth 3 (three) times daily as needed. (Patient not taking: Reported on 08/04/2022) 30 tablet 0   levothyroxine (SYNTHROID) 50 MCG tablet Take 0.5 tablets (25 mcg total) by mouth daily before breakfast. 90 tablet 0   liothyronine (CYTOMEL) 5 MCG tablet Take 1 tablet (5 mcg total) by mouth daily before breakfast. 180 tablet 0   loratadine (CLARITIN) 10 MG tablet Take 1 tablet (10 mg total) by mouth daily. 30 tablet 2   ondansetron (ZOFRAN) 8 MG tablet Take 1 tablet (8 mg total) by mouth every 8 (eight) hours as needed for nausea or vomiting. (Patient not taking: Reported on 06/30/2022) 20 tablet 0   Propylene Glycol (SYSTANE BALANCE) 0.6 % SOLN Place 1 drop into both eyes in the morning and at bedtime.     No current facility-administered medications for this visit.    REVIEW OF SYSTEMS:   10 Point review of Systems was done is negative except as noted above.  PHYSICAL EXAMINATION: Vitals:   10/20/22 1318  BP: 135/60  Pulse: 88  Resp: 15  Temp: 98.2 F (36.8 C)  SpO2: 97%    NAD GENERAL:alert, in no acute distress and comfortable SKIN: no acute rashes, no significant lesions EYES: conjunctiva are pink and non-injected, sclera anicteric OROPHARYNX: MMM, no exudates, no oropharyngeal erythema or ulceration NECK: supple, no JVD LYMPH:  no palpable lymphadenopathy in the cervical, axillary or inguinal regions LUNGS: clear to auscultation b/l with normal respiratory effort HEART: regular rate & rhythm ABDOMEN:  normoactive bowel sounds , non  tender, not distended. Extremity: no pedal edema PSYCH: alert & oriented x 3 with fluent speech NEURO: no focal motor/sensory deficits  I have reviewed the data as listed  .    Latest Ref Rng & Units 10/20/2022    1:04 PM 10/06/2022    2:05 PM 09/23/2022    9:43 AM  CBC  WBC 4.0 - 10.5 K/uL 4.1  3.6  3.6   Hemoglobin 12.0 - 15.0 g/dL 13.0  13.2  12.9   Hematocrit 36.0 - 46.0 % 37.1  38.8  37.7   Platelets 150 - 400 K/uL 236  220  221     .    Latest Ref Rng & Units 10/20/2022    1:04 PM 10/06/2022    2:05 PM 09/23/2022    9:43 AM  CMP  Glucose  70 - 99 mg/dL 118  79  105   BUN 8 - 23 mg/dL '20  16  17   '$ Creatinine 0.44 - 1.00 mg/dL 0.84  0.72  0.72   Sodium 135 - 145 mmol/L 139  138  140   Potassium 3.5 - 5.1 mmol/L 4.0  3.9  3.9   Chloride 98 - 111 mmol/L 106  104  106   CO2 22 - 32 mmol/L '27  29  29   '$ Calcium 8.9 - 10.3 mg/dL 9.1  9.1  9.0   Total Protein 6.5 - 8.1 g/dL 6.6  6.7  6.8   Total Bilirubin 0.3 - 1.2 mg/dL 1.1  1.4  1.1   Alkaline Phos 38 - 126 U/L 78  74  77   AST 15 - 41 U/L '20  22  22   '$ ALT 0 - 44 U/L '20  22  28    '$ . CYTOLOGY - NON PAP  CASE: [id number]  PATIENT: Burley Saver  Non-Gynecological Cytology Report      Clinical History: Melanoma of the neck 20 years ago, multiple new small  sub Q nodules  Specimen Submitted:  A. SUBCUTANEOUS NODULE, LEFT LATERAL THIGH, FINE  NEEDLE ASPIRATION:    FINAL MICROSCOPIC DIAGNOSIS:  - Malignant cells present  - Consistent with metastatic melanoma   RADIOGRAPHIC STUDIES: I have personally reviewed the radiological images as listed and agreed with the findings in the report. MR BRAIN W WO CONTRAST  Result Date: 10/20/2022 CLINICAL DATA:  Metastatic melanoma follow-up. EXAM: MRI HEAD WITHOUT AND WITH CONTRAST TECHNIQUE: Multiplanar, multiecho pulse sequences of the brain and surrounding structures were obtained without and with intravenous contrast. CONTRAST:  5 cc of vueway intravenous COMPARISON:   08/01/2022 and 06/11/2022 FINDINGS: Brain: 3-4 mm homogeneous and low-grade enhancing focus in the anterior aspect of the right putamen which is occult on the other sequences except for gradient based where there is a hypointense appearance. The appearance favors capillary telangiectasia, especially given continued stability. No other areas of abnormal enhancement. Small FLAIR hyperintensities in the cerebral white matter attributed to chronic small vessel ischemia. No infarct, hydrocephalus, or collection. Vascular: Normal flow voids and vascular enhancements Skull and upper cervical spine: Normal marrow signal Sinuses/Orbits: Negative IMPRESSION: Continued stability of the 3 mm right basal ganglia lesion, features most consistent with capillary telangiectasia. Electronically Signed   By: Jorje Guild M.D.   On: 10/20/2022 04:44   NM PET Image Restage (PS) Whole Body  Result Date: 10/15/2022 CLINICAL DATA:  Subsequent treatment strategy for metastatic melanoma. Patient status post 6 cycles immunotherapy. EXAM: NUCLEAR MEDICINE PET WHOLE BODY TECHNIQUE: 5.3 mCi F-18 FDG was injected intravenously. Full-ring PET imaging was performed from the head to foot after the radiotracer. CT data was obtained and used for attenuation correction and anatomic localization. Fasting blood glucose: 103 mg/dl COMPARISON:  FDG PET scan 06/16/2002 FINDINGS: Mediastinal blood pool activity: SUV max 1.9 HEAD/NECK: No hypermetabolic activity in the scalp. No hypermetabolic cervical lymph nodes. Incidental CT findings: none CHEST: No hypermetabolic mediastinal or hilar nodes. No suspicious pulmonary nodules on the CT scan. Incidental CT findings: Small nodule in the LEFT upper lobe measuring 5 mm (image 16/7) is unchanged. Small nodule in the medial lateral LEFT lower lobe measuring 4 mm (71/7) compares to 4 mm ABDOMEN/PELVIS: No abnormal hypermetabolic activity within the liver, pancreas, adrenal glands, or spleen. No  hypermetabolic lymph nodes in the abdomen or pelvis. Incidental CT findings: No evidence inflammation or infection associated  with immunotherapy SKELETON: No focal hypermetabolic activity to suggest skeletal metastasis. Benign endplate changes in the lumbar spine with sclerosis Incidental CT findings: none EXTREMITIES: No abnormal hypermetabolic activity in the lower extremities. Incidental CT findings: none Othercolon Previous seen mild metabolic activity associated with cutaneous thickening is no longer evident. IMPRESSION: 1. No evidence metastatic melanoma on whole-body FDG PET scan. 2. Previous seen skin thickening with associated mild metabolic activity is no longer appreciated. 3. Stable small pulmonary nodules. 4. No evidence adverse reaction associated with immunotherapy. Electronically Signed   By: Suzy Bouchard M.D.   On: 10/15/2022 14:06    ASSESSMENT & PLAN:    66 year old female with  #1  Recently diagnosed metastatic melanoma presenting with right upper neck mass and numerous skin nodules over upper extremities trunk and upper proximal thigh. #2 remote history of melanoma in 2001 resected from the back #3 on multiple supplements follows with functional medicine #4 I indeterminate lung nodule  Plan -Labs done today were discussed in detail with the patient. CBC and CMP has been stable. -She has no notable new toxicities from her nivolumab immunotherapy. -Her referral paperwork will be sent to [facility] for continued treatment for her metastatic melanoma. -Reviewed her MRI scan and PET scan to the patient and her husband. MRI and PET scans show she is in remission.  -Patient does not want to receive her infusion during today's visit.   Transfer care to  [place] for continued treatment for her metastatic melanoma.  The total time spent in the appointment was 30 minutes* .  All of the patient's questions were  answered with apparent satisfaction. The patient knows to call the clinic with any problems, questions or concerns.   Zettie Cooley, am acting as a scribe for Sullivan Lone, MD.  Sullivan Lone MD [place] AAHIVMS [facility] Hematology/Oncology Physician [facility]  .*Total Encounter Time as defined by the Centers for Medicare and Medicaid Services includes, in addition to the face-to-face time of a patient visit (documented in the note above) non-face-to-face time: obtaining and reviewing outside history, ordering and reviewing medications, tests or procedures, care coordination (communications with other health care professionals or caregivers) and documentation in the medical record.

## 2022-10-20 NOTE — Progress Notes (Signed)
[facility] at [place], [place] [address] [phone]   Interval Evaluation  Date of Service: 10/20/22 Patient Name: Frances Bright Patient MRN: [id number] Patient DOB: [birth year] Provider: Ventura Sellers, MD  Identifying Statement:  Frances Bright is a 66 y.o. female with Abnormal finding on MRI of brain   Primary Cancer:  Oncologic History: Oncology History  Metastatic melanoma ([facility])  06/22/2022 Initial Diagnosis   Metastatic melanoma ([facility])   07/09/2022 - 09/03/2022 Chemotherapy   Patient is on Treatment Plan : MELANOMA Nivolumab + Ipilimumab q21d / Nivolumab q28d     09/23/2022 -  Chemotherapy   Patient is on Treatment Plan : METASTATIC MELANOMA Nivolumab (240) q14d       Interval History: Frances Bright presents today for follow up after MRI scan.  She continues to describe no neurologic complaints.  Getting ipi and nivo with Dr. Irene Limbo.  Maintains functional independence, denies headaches, seizures.  Medications: Current Outpatient Medications on File Prior to Visit  Medication Sig Dispense Refill   levothyroxine (SYNTHROID) 50 MCG tablet Take 0.5 tablets (25 mcg total) by mouth daily before breakfast. 90 tablet 0   liothyronine (CYTOMEL) 5 MCG tablet Take 1 tablet (5 mcg total) by mouth daily before breakfast. 180 tablet 0   Propylene Glycol (SYSTANE BALANCE) 0.6 % SOLN Place 1 drop into both eyes in the morning and at bedtime.     No current facility-administered medications on file prior to visit.    Allergies:  Allergies  Allergen Reactions   Prednisone Swelling and Other (See Comments)    Patient noted leg swelling and severe headache with initiation of prednisone. Dr. Irene Limbo aware. Pt noted resolution of s/s after d/c medication.   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 Dr Sharol Roussel   Anxiety    Arthritis    Atrophic vaginitis    Diverticulosis 02/2008   Enteritis, candida 2011   GERD (gastroesophageal reflux disease)    Gluten intolerance 2012   H/O bilateral breast implants    Hypothyroidism    Melanoma ([facility]) 2001   back of neck   PMB (postmenopausal bleeding) 09/2012   On HRT - PUS/ SHGM with fibroids   Sleep apnea    Past Surgical History:  Past Surgical History:  Procedure Laterality Date   BREAST ENHANCEMENT SURGERY Bilateral 30 years ago   silicone, replaced with saline 6 years ago   [facility]     over 64yr ago for abnormal pap smear   MASS EXCISION N/A 06/02/2022   Procedure: EXCISION SKIN NODULES LEFT BREAST, Left Arm,ABDOMINAL WALL, AND RIGHT NECK;  Surgeon: BCoralie Keens MD;  Location: [facility]  Service: General;  Laterality: N/A;   MELANOMA EXCISION  2001   back of neck   MYOMECTOMY  about 1987   for fibroids   UMBILICAL HERNIA REPAIR N/A 07/31/2021   Procedure: UMBILICAL HERNIA REPAIR;  Surgeon: BCoralie Keens MD;  Location: [facility]  Service: General;  Laterality: N/A;  LMA   Social History:  Social History   Socioeconomic History   Marital status: Married    Spouse name: Not on file   Number of children: 0   Years of education: Not on file   Highest  education level: Not on file  Occupational History   Not on file  Tobacco Use   Smoking status: Never   Smokeless tobacco: Never  Vaping Use   Vaping Use: Never used  Substance and Sexual Activity   Alcohol use: No   Drug use: No   Sexual activity: Not Currently    Partners: Male    Birth control/protection: Post-menopausal  Other Topics Concern   Not on file  Social History Narrative   Not on file   Social Determinants of Health   Financial Resource Strain: Not on file  Food Insecurity: Not on file  Transportation Needs: Not on file  Physical Activity: Not on file  Stress: Not on file  Social  Connections: Not on file  Intimate Partner Violence: Not on file   Family History:  Family History  Problem Relation Age of Onset   Hypertension Mother    Hyperlipidemia Mother    Cancer Father     Review of Systems: Constitutional: Doesn't report fevers, chills or abnormal weight loss Eyes: Doesn't report blurriness of vision Ears, nose, mouth, throat, and face: Doesn't report sore throat Respiratory: Doesn't report cough, dyspnea or wheezes Cardiovascular: Doesn't report palpitation, chest discomfort  Gastrointestinal:  Doesn't report nausea, constipation, diarrhea GU: Doesn't report incontinence Skin: Doesn't report skin rashes Neurological: Per HPI Musculoskeletal: Doesn't report joint pain Behavioral/Psych: Doesn't report anxiety  Physical Exam: There were no vitals filed for this visit.   KPS: 90. General: Alert, cooperative, pleasant, in no acute distress Head: Normal EENT: No conjunctival injection or scleral icterus.  Lungs: Resp effort normal Cardiac: Regular rate Abdomen: Non-distended abdomen Skin: No rashes cyanosis or petechiae. Extremities: No clubbing or edema  Neurologic Exam: Mental Status: Awake, alert, attentive to examiner. Oriented to self and environment. Language is fluent with intact comprehension.  Cranial Nerves: Visual acuity is grossly normal. Extra-ocular movements intact. No ptosis. Face is symmetric Motor: Tone and bulk are normal. Power is full in both arms and legs.  Sensory: Intact Gait: Normal.   Labs: I have reviewed the data as listed    Component Value Date/Time   NA 139 10/20/2022 1304   K 4.0 10/20/2022 1304   CL 106 10/20/2022 1304   CO2 27 10/20/2022 1304   GLUCOSE 118 (H) 10/20/2022 1304   BUN 20 10/20/2022 1304   CREATININE 0.84 10/20/2022 1304   CALCIUM 9.1 10/20/2022 1304   PROT 6.6 10/20/2022 1304   ALBUMIN 4.4 10/20/2022 1304   AST 20 10/20/2022 1304   ALT 20 10/20/2022 1304   ALKPHOS 78 10/20/2022 1304    BILITOT 1.1 10/20/2022 1304   GFRNONAA >60 10/20/2022 1304   GFRAA 113 01/12/2009 0954   Lab Results  Component Value Date   WBC 4.1 10/20/2022   NEUTROABS 2.2 10/20/2022   HGB 13.0 10/20/2022   HCT 37.1 10/20/2022   MCV 88.5 10/20/2022   PLT 236 10/20/2022    Imaging:  MR BRAIN W WO CONTRAST  Result Date: 10/20/2022 CLINICAL DATA:  Metastatic melanoma follow-up. EXAM: MRI HEAD WITHOUT AND WITH CONTRAST TECHNIQUE: Multiplanar, multiecho pulse sequences of the brain and surrounding structures were obtained without and with intravenous contrast. CONTRAST:  5 cc of vueway intravenous COMPARISON:  08/01/2022 and 06/11/2022 FINDINGS: Brain: 3-4 mm homogeneous and low-grade enhancing focus in the anterior aspect of the right putamen which is occult on the other sequences except for gradient based where there is a hypointense appearance. The appearance favors capillary telangiectasia, especially given continued stability. No  other areas of abnormal enhancement. Small FLAIR hyperintensities in the cerebral white matter attributed to chronic small vessel ischemia. No infarct, hydrocephalus, or collection. Vascular: Normal flow voids and vascular enhancements Skull and upper cervical spine: Normal marrow signal Sinuses/Orbits: Negative IMPRESSION: Continued stability of the 3 mm right basal ganglia lesion, features most consistent with capillary telangiectasia. Electronically Signed   By: Jorje Guild M.D.   On: 10/20/2022 04:44   NM PET Image Restage (PS) Whole Body  Result Date: 10/15/2022 CLINICAL DATA:  Subsequent treatment strategy for metastatic melanoma. Patient status post 6 cycles immunotherapy. EXAM: NUCLEAR MEDICINE PET WHOLE BODY TECHNIQUE: 5.3 mCi F-18 FDG was injected intravenously. Full-ring PET imaging was performed from the head to foot after the radiotracer. CT data was obtained and used for attenuation correction and anatomic localization. Fasting blood glucose: 103 mg/dl  COMPARISON:  FDG PET scan 06/16/2002 FINDINGS: Mediastinal blood pool activity: SUV max 1.9 HEAD/NECK: No hypermetabolic activity in the scalp. No hypermetabolic cervical lymph nodes. Incidental CT findings: none CHEST: No hypermetabolic mediastinal or hilar nodes. No suspicious pulmonary nodules on the CT scan. Incidental CT findings: Small nodule in the LEFT upper lobe measuring 5 mm (image 16/7) is unchanged. Small nodule in the medial lateral LEFT lower lobe measuring 4 mm (71/7) compares to 4 mm ABDOMEN/PELVIS: No abnormal hypermetabolic activity within the liver, pancreas, adrenal glands, or spleen. No hypermetabolic lymph nodes in the abdomen or pelvis. Incidental CT findings: No evidence inflammation or infection associated with immunotherapy SKELETON: No focal hypermetabolic activity to suggest skeletal metastasis. Benign endplate changes in the lumbar spine with sclerosis Incidental CT findings: none EXTREMITIES: No abnormal hypermetabolic activity in the lower extremities. Incidental CT findings: none Othercolon Previous seen mild metabolic activity associated with cutaneous thickening is no longer evident. IMPRESSION: 1. No evidence metastatic melanoma on whole-body FDG PET scan. 2. Previous seen skin thickening with associated mild metabolic activity is no longer appreciated. 3. Stable small pulmonary nodules. 4. No evidence adverse reaction associated with immunotherapy. Electronically Signed   By: Suzy Bouchard M.D.   On: 10/15/2022 14:06    [facility] Clinician Interpretation: I have personally reviewed the radiological images as listed.  My interpretation, in the context of the patient's clinical presentation, is  metastasis vs cavernoma   Assessment/Plan Abnormal finding on MRI of brain  Frances Bright is clinically stable today.  MRI brain demonstrates stable findings within right basal ganglia.  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  Though unlikely, it might be reasonable to  circle back in ~1 year to completely rule out CNS melanoma that has responded to immotherapy.  We appreciate the opportunity to participate in the care of Frances Bright.   We ask that Frances Bright return to clinic as needed given their planned move to [place].   All questions were answered. The patient knows to call the clinic with any problems, questions or concerns. No barriers to learning were detected.  The total time spent in the encounter was 30 minutes and more than 50% was on counseling and review of test results   Ventura Sellers, MD Medical Director of Neuro-Oncology [facility] at [place] 10/20/22 3:17 PM

## 2022-10-20 NOTE — Progress Notes (Signed)
Requested imaging be transfer patient via disc.

## 2022-10-21 LAB — T4: T4, Total: 5.5 ug/dL (ref 4.5–12.0)

## 2022-10-24 ENCOUNTER — Other Ambulatory Visit: Payer: Self-pay | Admitting: Hematology

## 2022-10-26 ENCOUNTER — Encounter: Payer: Self-pay | Admitting: Hematology

## 2022-11-03 ENCOUNTER — Ambulatory Visit: Payer: Medicare HMO | Admitting: Hematology

## 2022-11-03 ENCOUNTER — Other Ambulatory Visit: Payer: Medicare HMO

## 2022-11-03 ENCOUNTER — Ambulatory Visit: Payer: Medicare HMO

## 2022-11-18 ENCOUNTER — Ambulatory Visit: Payer: Medicare HMO | Admitting: Family Medicine

## 2022-12-12 DIAGNOSIS — S99922A Unspecified injury of left foot, initial encounter: Secondary | ICD-10-CM | POA: Diagnosis not present

## 2023-03-04 ENCOUNTER — Other Ambulatory Visit: Payer: Self-pay

## 2023-03-29 ENCOUNTER — Other Ambulatory Visit: Payer: Self-pay

## 2023-04-16 IMAGING — CT CT CHEST-ABD-PELV W/ CM
2 of 5 series · 8 of 36 positions shown, 14 images · IV contrast (agent unspecified)
Comparison: CT July 25, 2020

CLINICAL DATA: History of melanoma. Multiple skin nodules under
left breast, upper abdomen and arms. * Tracking Code: BO *

EXAM:
CT CHEST, ABDOMEN, AND PELVIS WITH CONTRAST
TECHNIQUE: Multidetector CT imaging of the chest, abdomen and pelvis was
performed following the standard protocol during bolus
administration of intravenous contrast.

[Series 3: cap with · axial · 0.71mm/px · z∈[-683,-258]mm · 5 of 129 slices shown, 10 images]
[im 22/129  mediastinal]
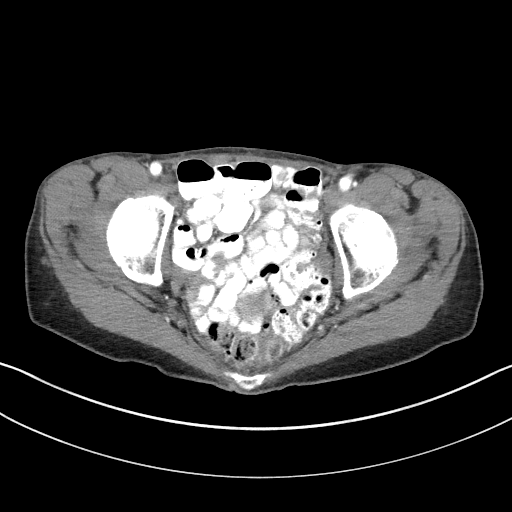
[im 22/129  bone]
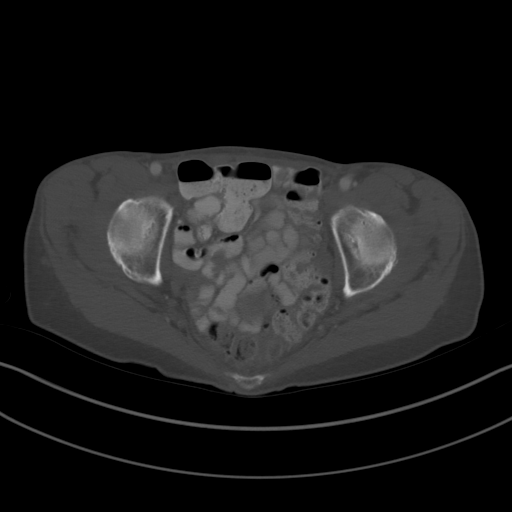
[im 43/129  mediastinal]
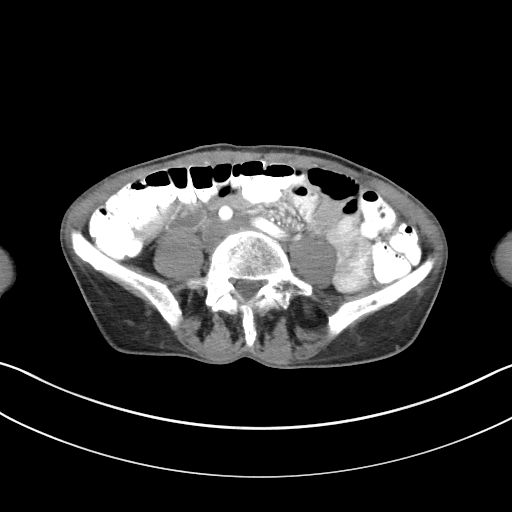
[im 43/129  lung]
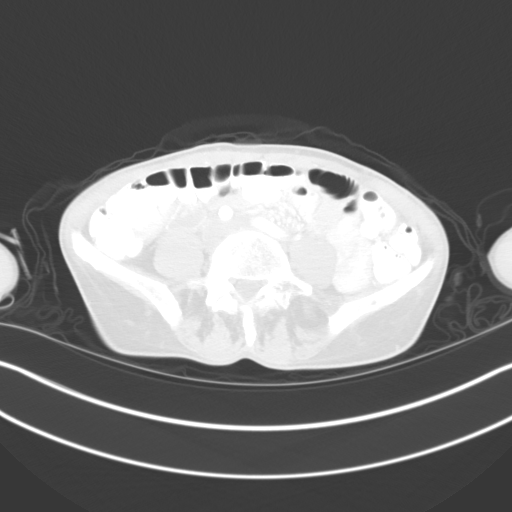
[im 65/129  mediastinal]
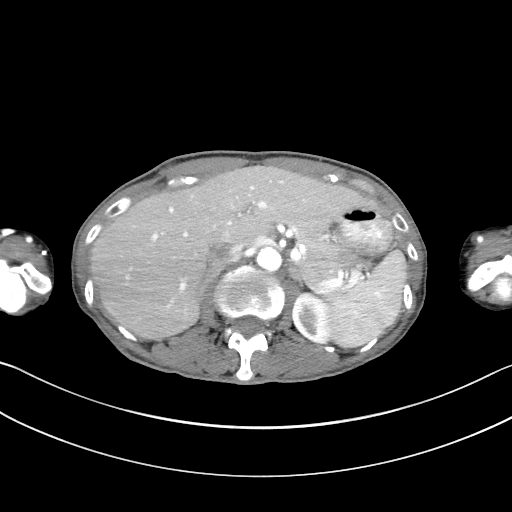
[im 65/129  lung]
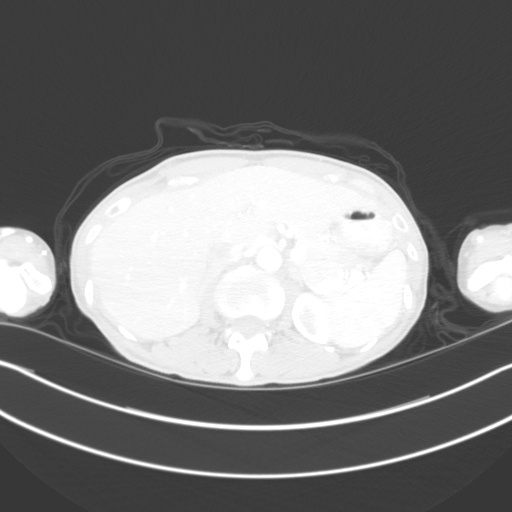
[im 86/129  mediastinal]
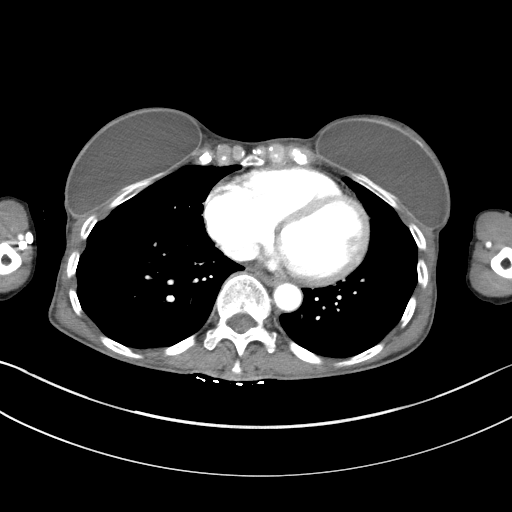
[im 86/129  lung]
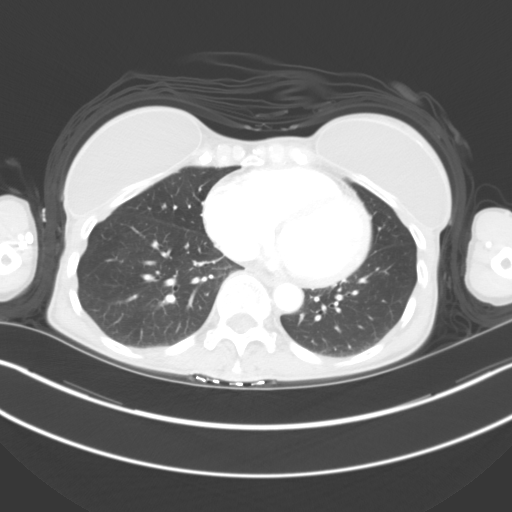
[im 107/129  mediastinal]
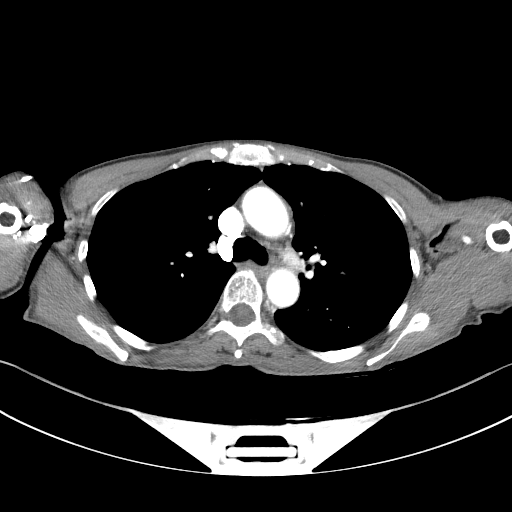
[im 107/129  lung]
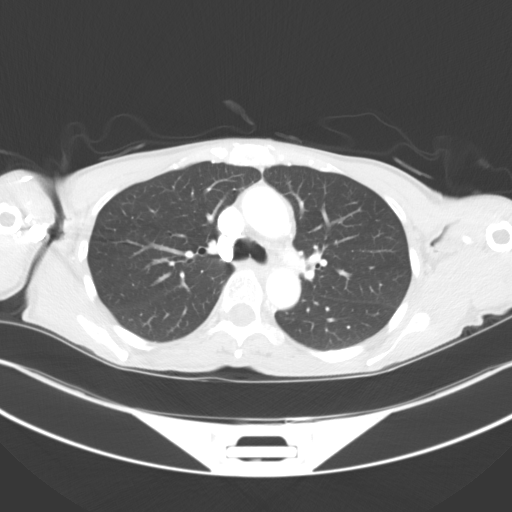

[Series 6: coronals · coronal · 0.68mm/px · 3 of 109 slices shown, 4 images]
[im 22/109  mediastinal]
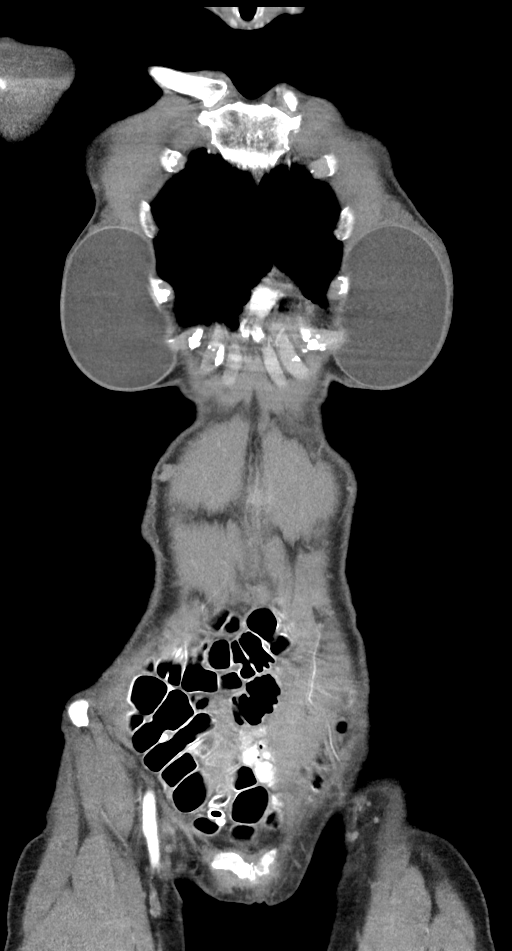
[im 44/109  mediastinal]
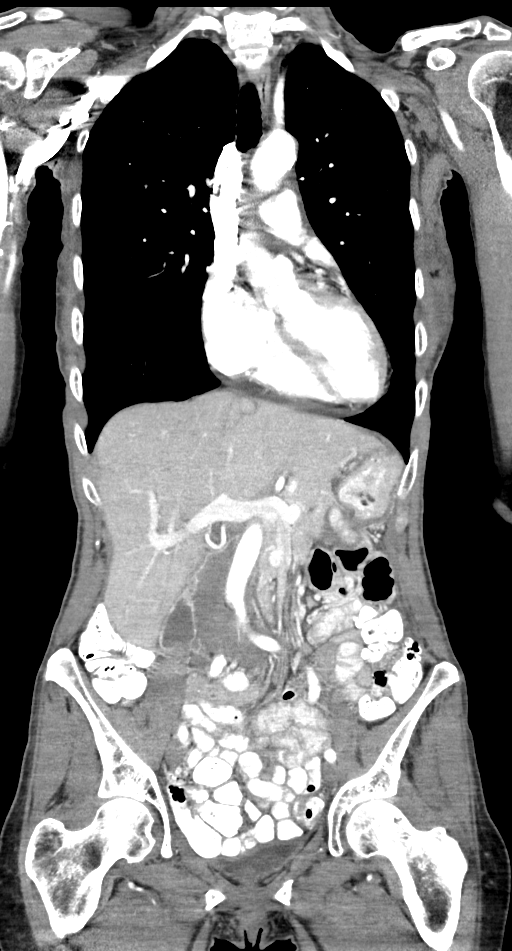
[im 44/109  bone]
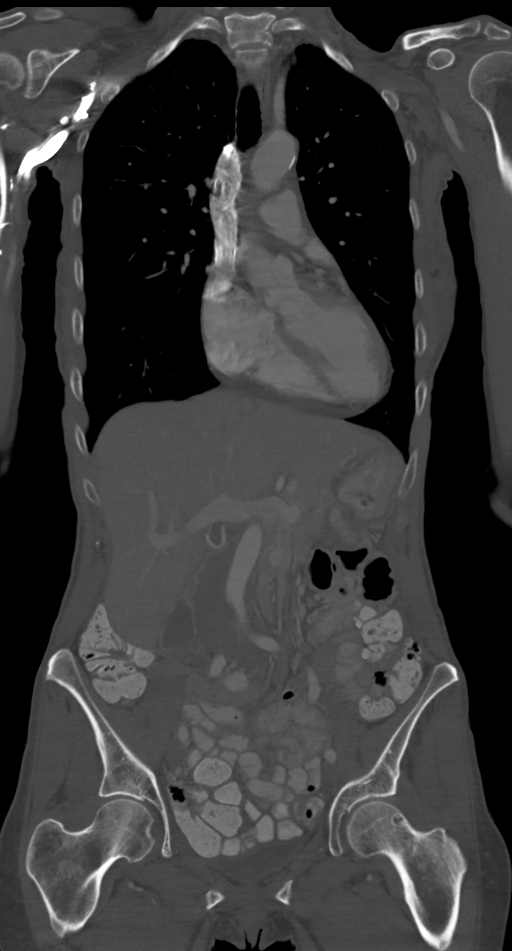
[im 65/109  mediastinal]
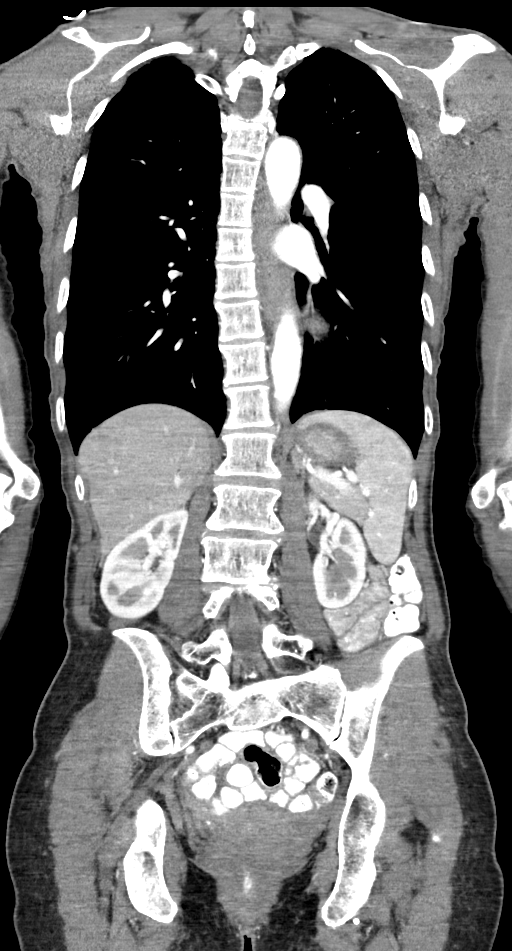

[8 of 36 positions shown; findings below may reference images not displayed]

RADIATION DOSE REDUCTION: This exam was performed according to the
departmental dose-optimization program which includes automated
exposure control, adjustment of the mA and/or kV according to
patient size and/or use of iterative reconstruction technique.

CONTRAST:  100mL OMNIPAQUE IOHEXOL 300 MG/ML  SOLN
FINDINGS: CT CHEST FINDINGS

Cardiovascular: Aortic atherosclerosis without aneurysmal dilation.
No central pulmonary embolus on this nondedicated study. Normal size
heart. No significant pericardial effusion/thickening.

Mediastinum/Nodes: No suspicious thyroid nodule. No pathologically
enlarged mediastinal, hilar or axillary lymph nodes. The esophagus
is grossly unremarkable.

Lungs/Pleura: Solid irregular 6 mm left upper lobe pulmonary nodule
on image [DATE]. No pleural effusion. No pneumothorax.

Musculoskeletal: Bilateral breast prostheses. Nodule in the left
breast subjacent to the breast prosthesis measures 13 x 8 mm on
image 52/3. Additional subcutaneous nodules further described below.

CT ABDOMEN PELVIS FINDINGS

Hepatobiliary: No suspicious hepatic lesion. Enhancing nodule in the
gallbladder measuring 5 mm on image 40/6. No evidence of acute
cholecystitis. No biliary ductal dilation.

Pancreas: No pancreatic ductal dilation or evidence of acute
inflammation.

Spleen: No splenomegaly or focal splenic lesion.

Adrenals/Urinary Tract: Bilateral adrenal glands appear normal. No
hydronephrosis. Urinary bladder is unremarkable for degree of
distension.

Stomach/Bowel: Radiopaque enteric contrast material traverses the
sigmoid colon. Stomach is unremarkable for degree of distension. No
pathologic dilation of large or small bowel. No evidence of acute
bowel inflammation. Moderate volume of formed stool throughout the
colon.

Vascular/Lymphatic: Normal caliber abdominal aorta. No
pathologically enlarged abdominal or pelvic lymph nodes.

Reproductive: Uterus and bilateral adnexa are unremarkable.

Other: Numerous scattered small soft tissue nodules in the
abdominopelvic subcutaneous fat. For reference:

-Right anterior subcutaneous nodule measures 9 mm on image 64/3 and
another measuring 5 mm on image 81/3.

-Subcutaneous nodule posterior to the right gluteal muscle measures
10 mm on image 128/3

-Nodule to the left gluteal musculature measuring 8 mm on image
125/3.

Musculoskeletal: No aggressive lytic or blastic lesion of bone.
IMPRESSION: 1. Numerous scattered small soft tissue nodules in the thoracic and
abdominopelvic subcutaneous tissues, suspicious for metastatic
disease. Suggest further evaluation with direct tissue sampling.
2. Solid irregular 6 mm left upper lobe pulmonary nodule, is
nonspecific but concerning for metastatic disease.
3. Enhancing nodule in the gallbladder measuring 5 mm, nonspecific
possibly reflecting a gallbladder polyp but also somewhat suspicious
for metastasis.
4. Moderate volume of formed stool throughout the colon.
5.  Aortic Atherosclerosis ([id number]).

These results will be called to the ordering clinician or
representative by the Radiologist Assistant, and communication
documented in the PACS or [REDACTED].

## 2023-04-16 IMAGING — CT CT NECK W/ CM
4 of 5 series · 14 of 33 positions shown, 16 images · IV contrast (agent unspecified)
Comparison: None Available.

CLINICAL DATA: Right neck mask and sin lesions.

EXAM:
CT NECK WITH CONTRAST
TECHNIQUE: Multidetector CT imaging of the neck was performed using the
standard protocol following the bolus administration of intravenous
contrast.

[Series 5: axial bone · axial · 0.43mm/px · z∈[-208,-90]mm · 3 of 119 slices shown, 4 images]
[im 30/119  soft-tissue]
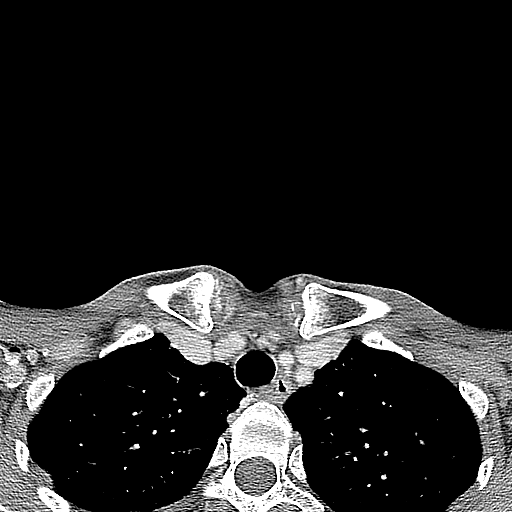
[im 30/119  bone]
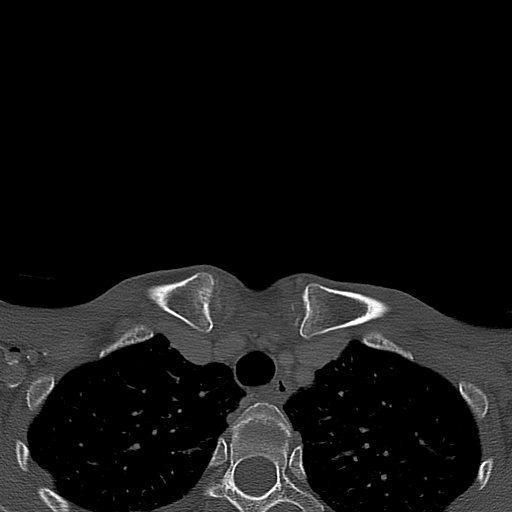
[im 60/119  bone]
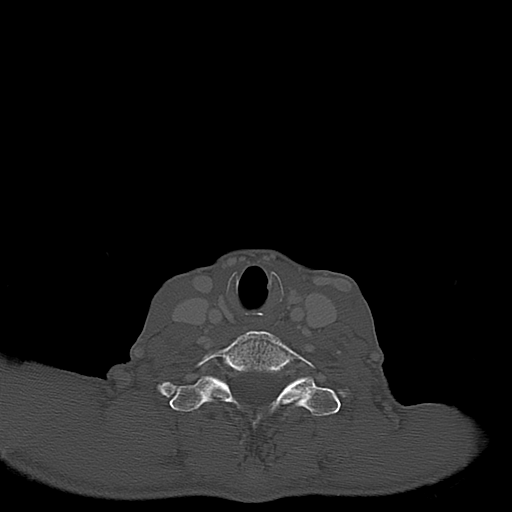
[im 89/119  bone]
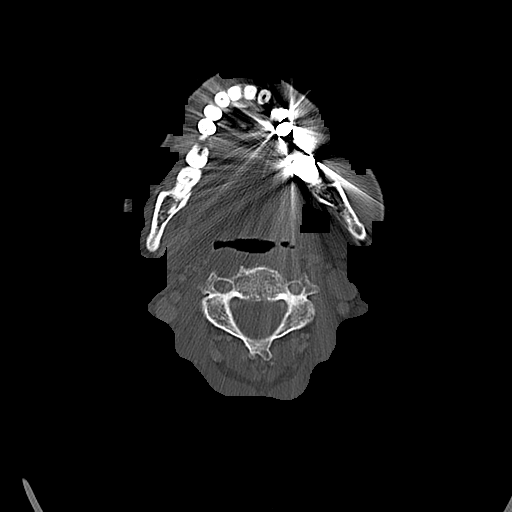

[Series 6: orthogonal (person_name) · axial · 0.39mm/px · z∈[-208,-90]mm · 3 of 119 slices shown]
[im 30/119  bone]
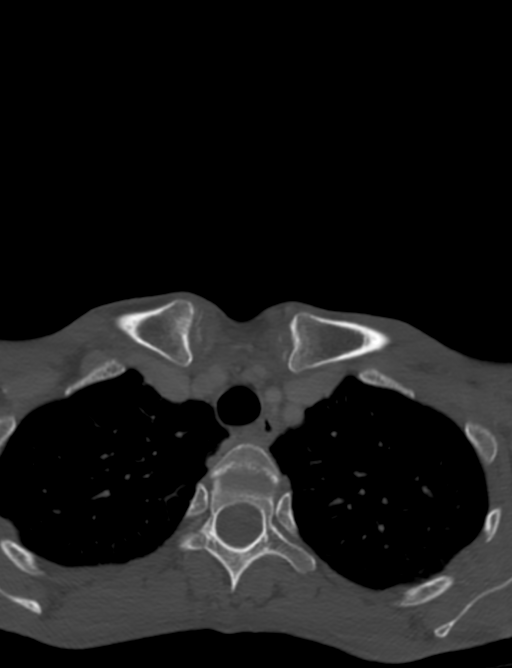
[im 60/119  bone]
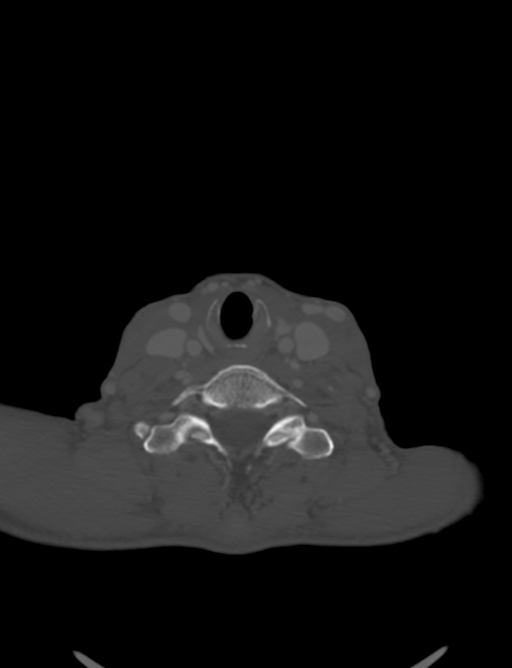
[im 89/119  bone]
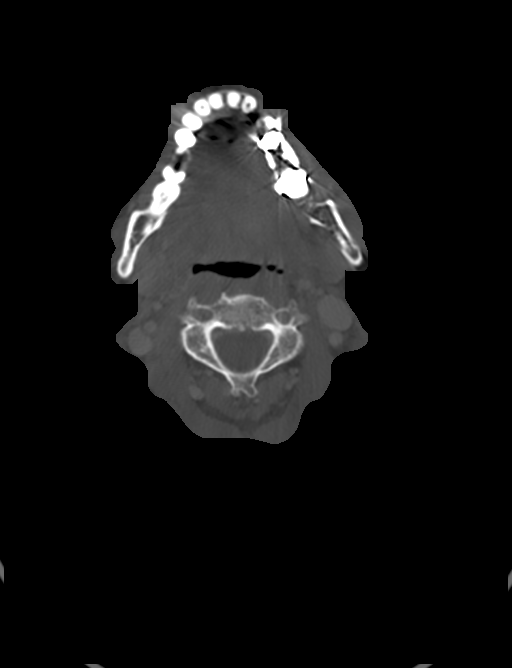

[Series 7: cor neck · coronal · 0.39mm/px · 3 of 131 slices shown]
[im 27/131  bone]
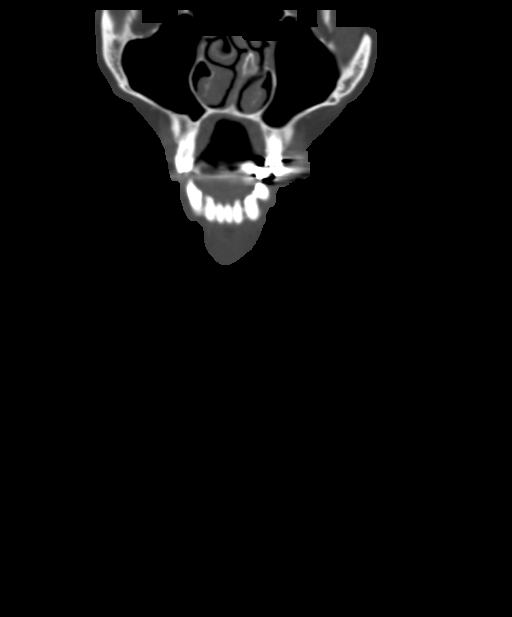
[im 53/131  bone]
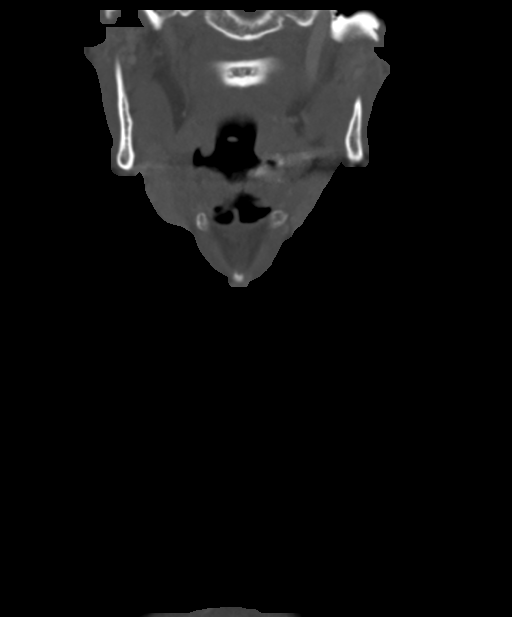
[im 79/131  bone]
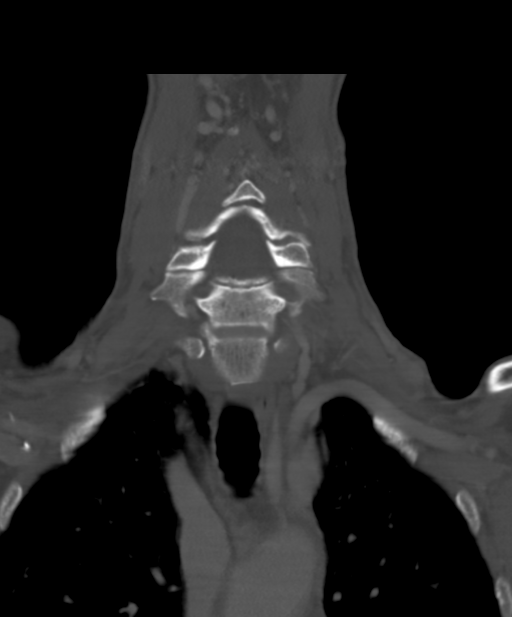

[Series 8: sag neck · sagittal · 0.47mm/px · 5 of 101 slices shown, 6 images]
[im 34/101  bone]
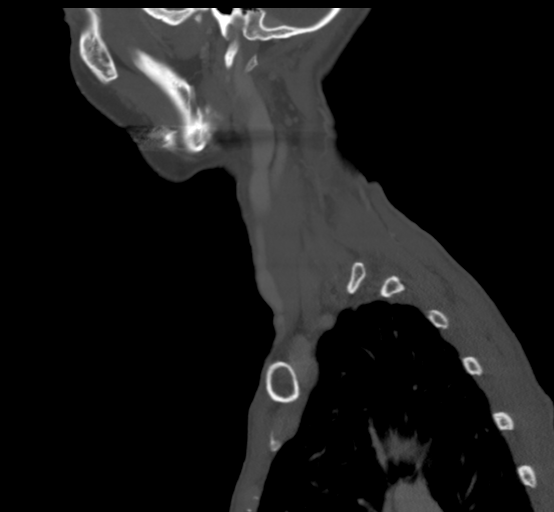
[im 42/101  bone]
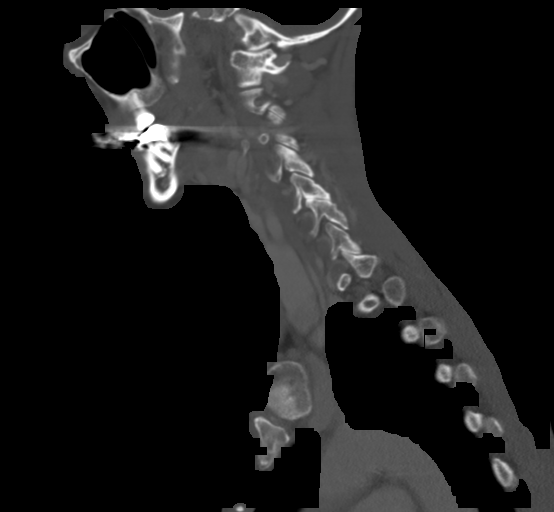
[im 51/101  soft-tissue]
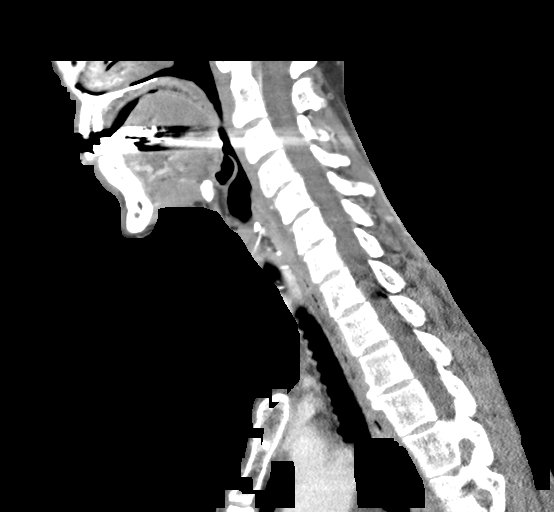
[im 51/101  bone]
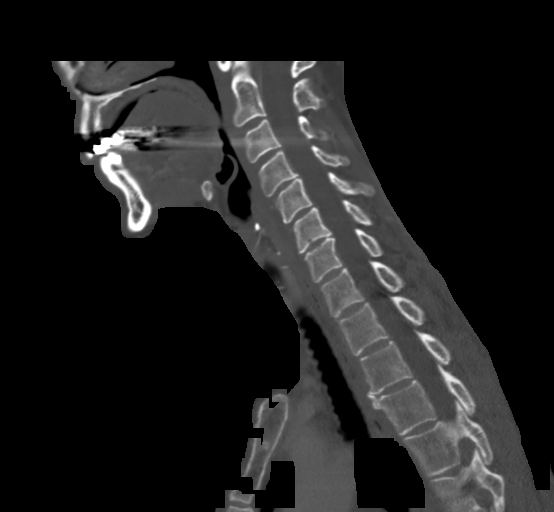
[im 59/101  bone]
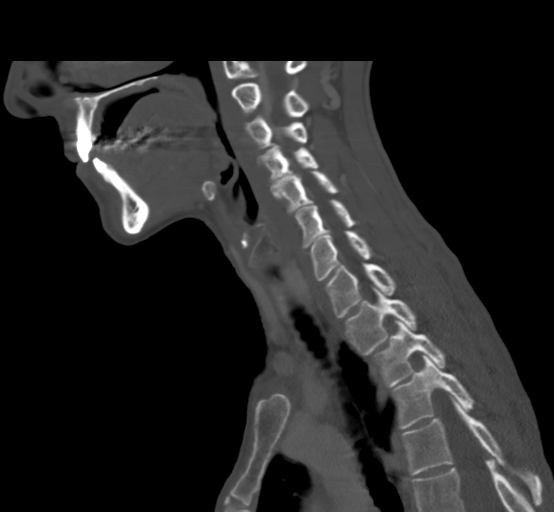
[im 67/101  bone]
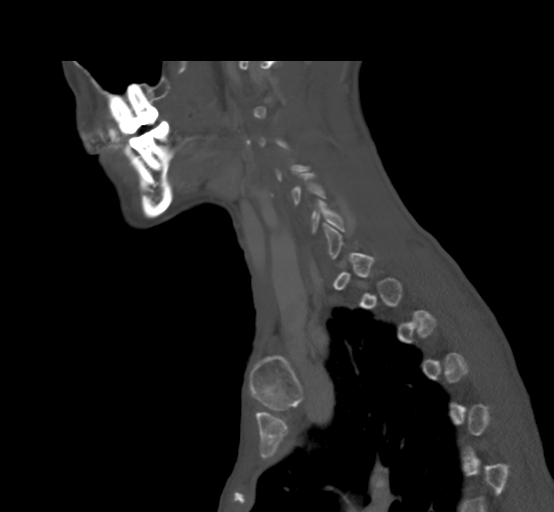

[14 of 33 positions shown; findings below may reference images not displayed]

RADIATION DOSE REDUCTION: This exam was performed according to the
departmental dose-optimization program which includes automated
exposure control, adjustment of the mA and/or kV according to
patient size and/or use of iterative reconstruction technique.

CONTRAST:  100mL OMNIPAQUE IOHEXOL 300 MG/ML  SOLN
FINDINGS: Pharynx and larynx: No evidence of mass or inflammation.

Salivary glands: No inflammation, mass, or stone.

Thyroid: Subcentimeter nodules measuring up to 6 mm on the right. No
followup recommended (ref: [HOSPITAL]. [DATE]):

Lymph nodes: Palpable complaint correlates with a right upper
subcutaneous nodule which measures up to 13 mm and shows
heterogeneity from low-density components. No additional adenopathy
seen throughout the neck.

Vascular: Negative

Limited intracranial: Negative

Visualized orbits: Negative

Mastoids and visualized paranasal sinuses: Clear

Skeleton: Ordinary cervical spine degeneration

Upper chest: Clear apical lungs
IMPRESSION: Single subcutaneous nodule in the upper right neck, likely solitary
enlarged lymph node. The node is heterogeneous which could be from
partial liquefaction/necrosis. No primary mass or infection seen,
consider biopsy.

## 2023-05-05 IMAGING — US US FINE NEEDLE ASP 1ST LESION
1 series · 14 of 17 positions shown · non-contrast
Comparison: none

INDICATION: 66-year-old female with remote history of cutaneous melanoma 20
years previously. Now she has numerous small superficial
subcutaneous nodules with a clinical concern for metastatic
melanoma.

[Series 1: us biopsy abd rp mass mc & wl · 14 of 17 slices shown]
[im 1/17]
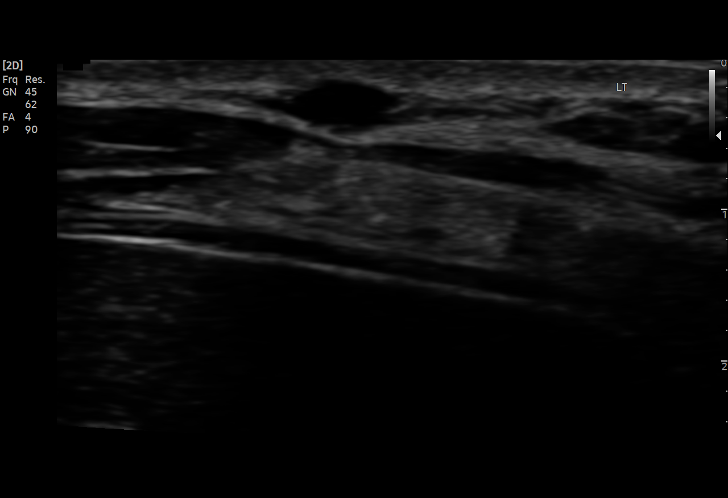
[im 2/17]
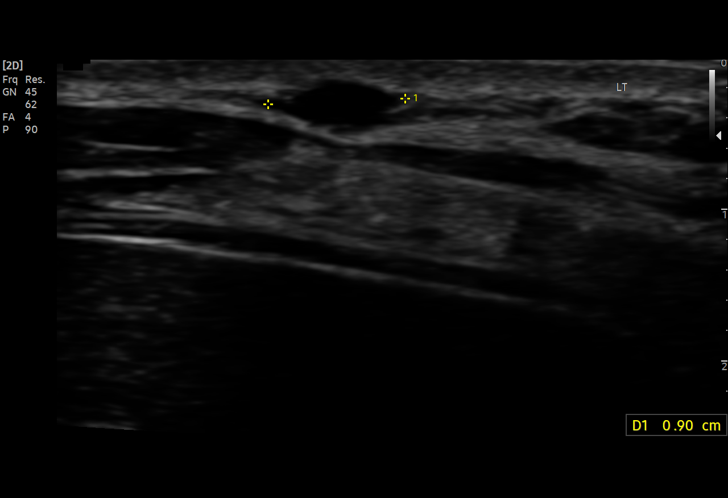
[im 4/17]
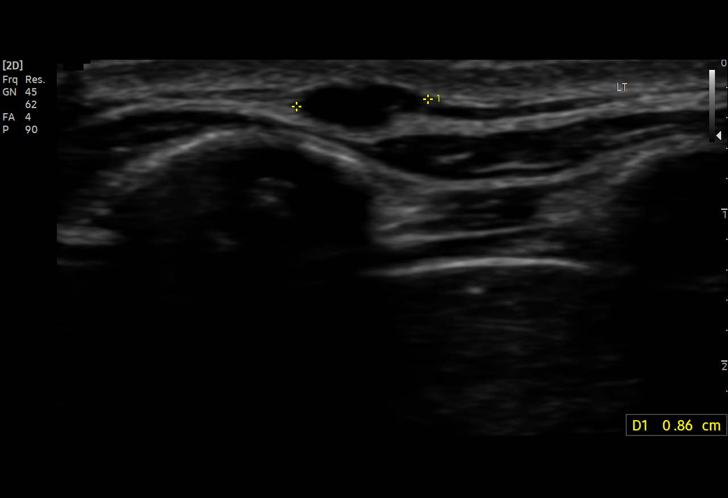
[im 5/17]
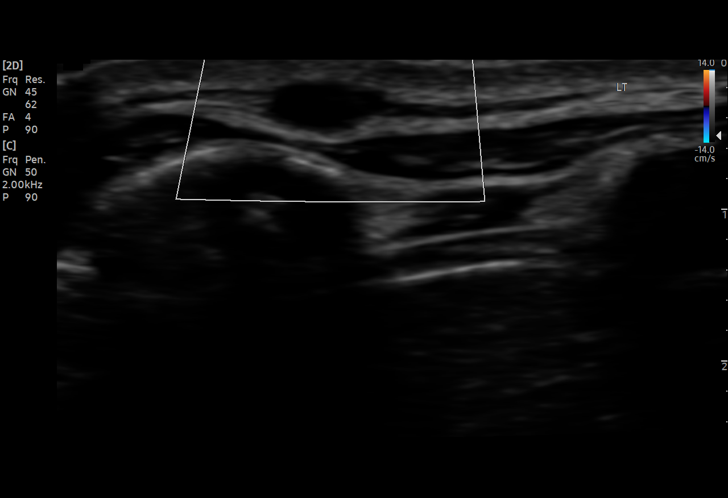
[im 6/17]
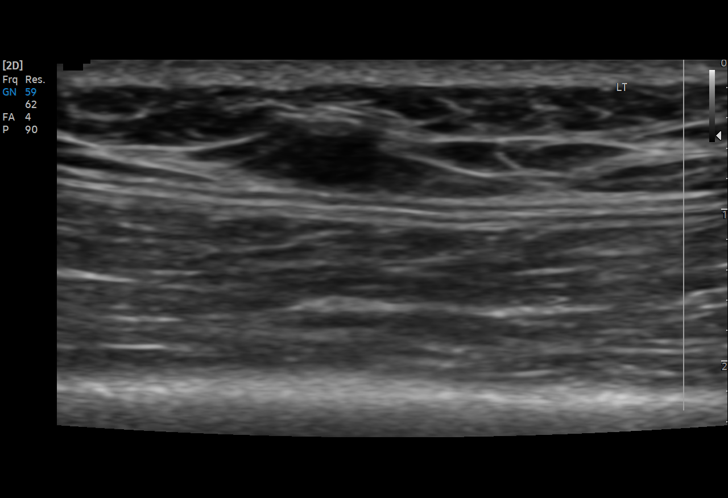
[im 7/17]
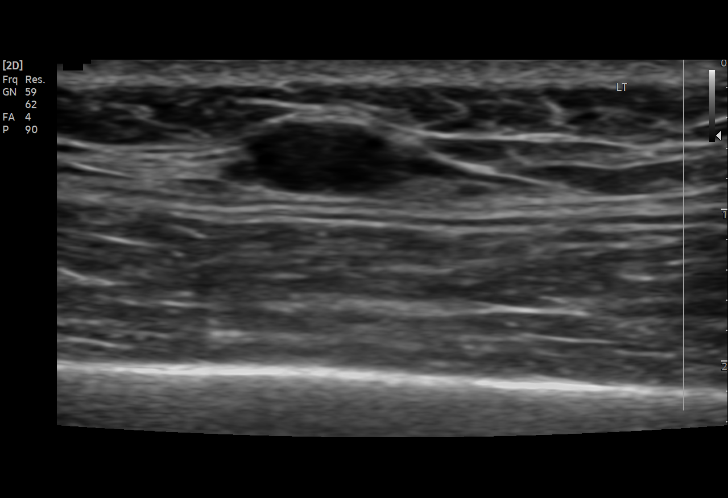
[im 8/17]
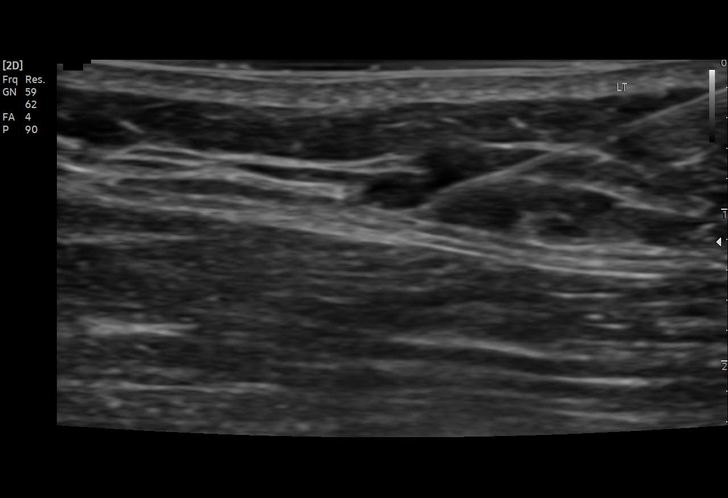
[im 10/17]
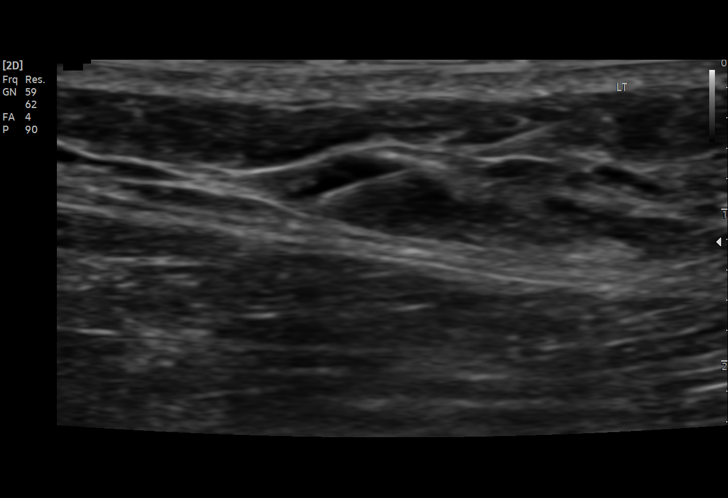
[im 11/17]
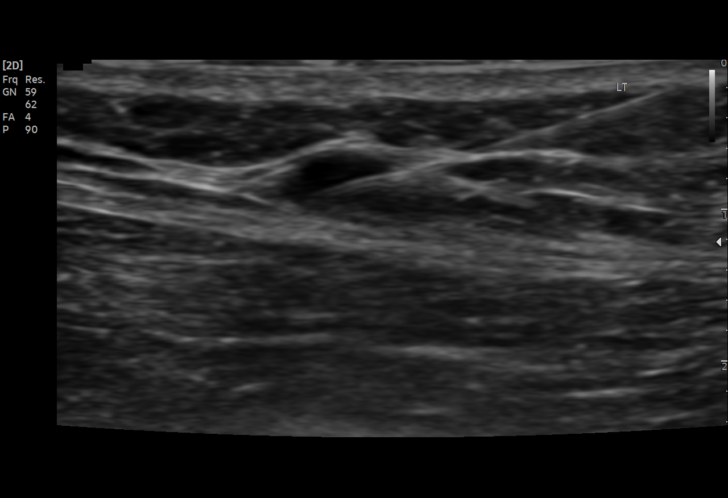
[im 12/17]
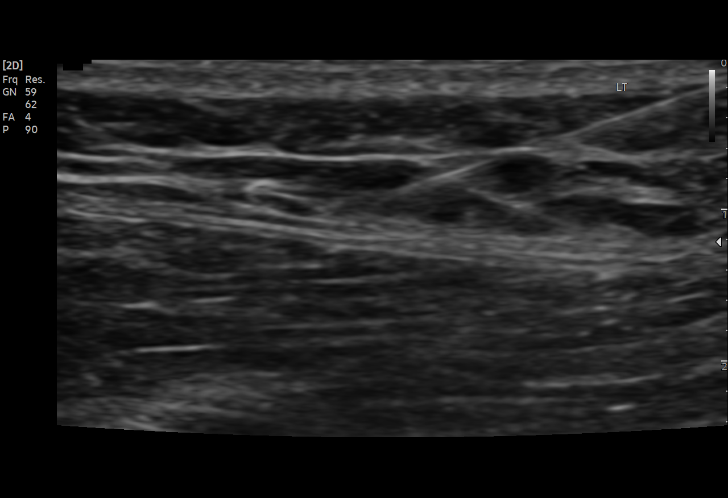
[im 13/17]
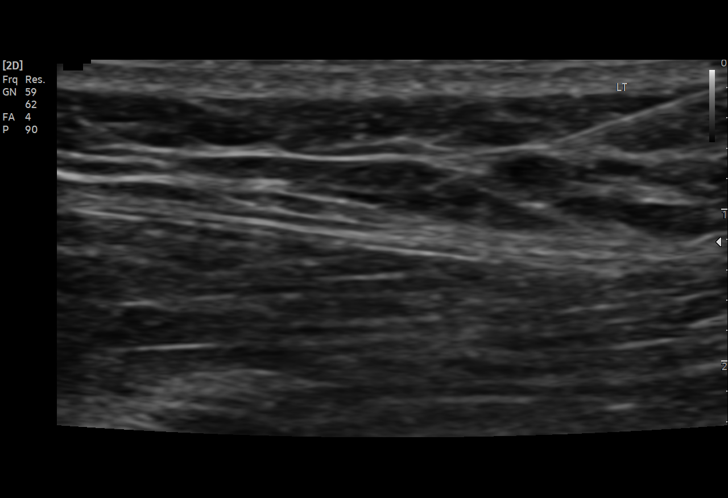
[im 14/17]
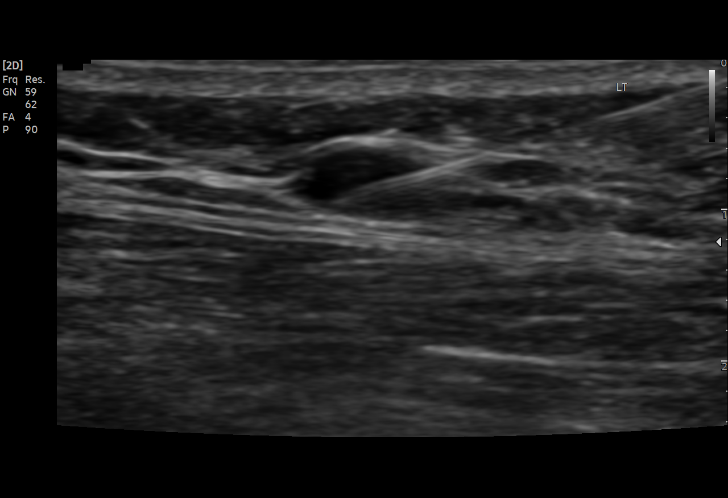
[im 16/17]
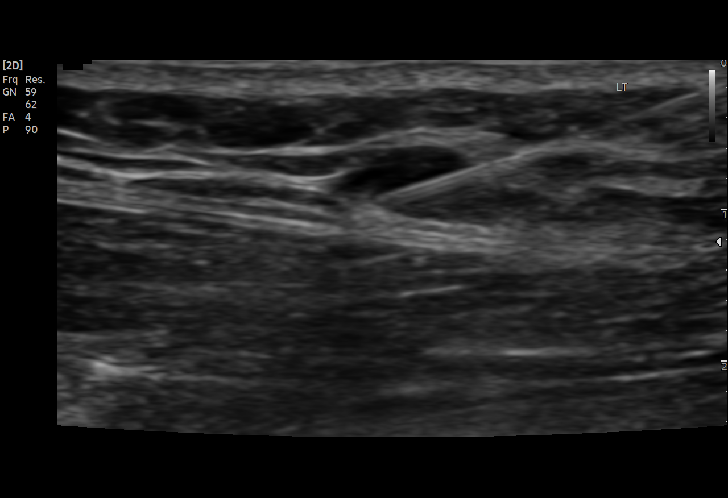
[im 17/17]
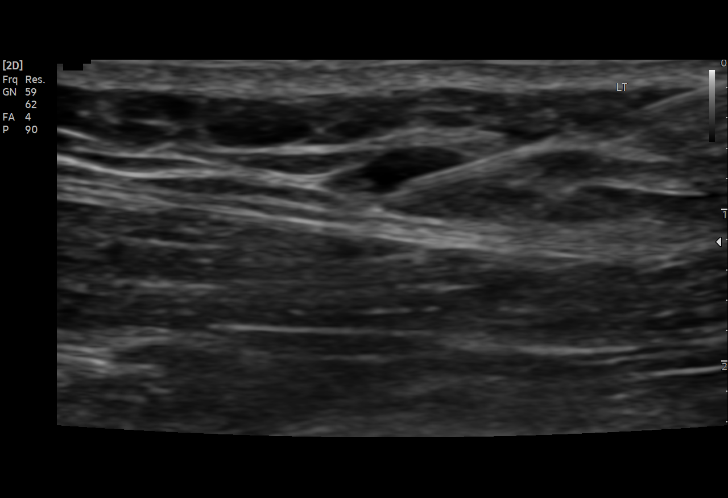

[14 of 17 positions shown; findings below may reference images not displayed]

EXAM:
Ultrasound-guided fine-needle aspiration biopsy.

MEDICATIONS:
None.

ANESTHESIA/SEDATION:
None.

COMPLICATIONS:
None immediate.

PROCEDURE:
Informed written consent was obtained from the patient after a
thorough discussion of the procedural risks, benefits and
alternatives. All questions were addressed. Maximal Sterile Barrier
Technique was utilized including caps, mask, sterile gowns, sterile
gloves, sterile drape, hand hygiene and skin antiseptic. A timeout
was performed prior to the initiation of the procedure.

Ultrasound was used to interrogate multiple of the subcutaneous
nodules to identified the ideal target site. Nodules just inferior
to the left breast, along the right anterior abdominal wall, the
left lateral upper arm, in the left lateral mid thigh were all
evaluated. All nodules are very small in size and extremely
superficial in the subcutaneous fat layer. Additionally, the nodules
are quite mobile. The thigh lesion was slightly deeper and less
mobile. Therefore, it was targeted for biopsy. Given the very small
size of the nodule, core biopsy is likely not attainable. Therefore,
we will proceed with ultrasound-guided fine-needle aspiration
biopsy.

A suitable skin entry site was selected and marked. The region was
sterilely prepped and draped in the standard fashion with
chlorhexidine skin prep. Local anesthesia was attained by
infiltration with 1% lidocaine. Under real-time ultrasound guidance,
multiple 25 gauge fine-needle aspirate biopsies were obtained.
Biopsy specimens were taken to pathology. A quick stain was
performed. I received confirmation of adequate tissue sampling. No
further samples were then obtained.
IMPRESSION: Technically successful ultrasound-guided fine-needle aspiration
biopsy of superficial subcutaneous nodule in the lateral aspect of
the left mid thigh.
